# Patient Record
Sex: Male | Born: 1985 | Race: Black or African American | Hispanic: No | Marital: Single | Smoking: Former smoker
Health system: Southern US, Community
[De-identification: ages and names within clinical notes are randomized; demographics above are authoritative.]

## PROBLEM LIST (undated history)

## (undated) DIAGNOSIS — I10 Essential (primary) hypertension: Secondary | ICD-10-CM

---

## 2013-04-21 ENCOUNTER — Encounter (HOSPITAL_COMMUNITY): Payer: Self-pay | Admitting: Emergency Medicine

## 2013-04-21 ENCOUNTER — Emergency Department (HOSPITAL_COMMUNITY)
Admission: EM | Admit: 2013-04-21 | Discharge: 2013-04-21 | Disposition: A | Discharge status: Home / Self Care | Payer: No Typology Code available for payment source | Attending: Emergency Medicine | Admitting: Emergency Medicine

## 2013-04-21 DIAGNOSIS — K089 Disorder of teeth and supporting structures, unspecified: Secondary | ICD-10-CM | POA: Diagnosis present

## 2013-04-21 DIAGNOSIS — F17200 Nicotine dependence, unspecified, uncomplicated: Secondary | ICD-10-CM | POA: Diagnosis not present

## 2013-04-21 DIAGNOSIS — K029 Dental caries, unspecified: Secondary | ICD-10-CM | POA: Diagnosis not present

## 2013-04-21 DIAGNOSIS — K044 Acute apical periodontitis of pulpal origin: Secondary | ICD-10-CM | POA: Diagnosis not present

## 2013-04-21 DIAGNOSIS — K002 Abnormalities of size and form of teeth: Secondary | ICD-10-CM | POA: Insufficient documentation

## 2013-04-21 DIAGNOSIS — K047 Periapical abscess without sinus: Secondary | ICD-10-CM

## 2013-04-21 DIAGNOSIS — K0889 Other specified disorders of teeth and supporting structures: Secondary | ICD-10-CM

## 2013-04-21 MED ORDER — HYDROCODONE-ACETAMINOPHEN 5-325 MG PO TABS: 1.0000 | ORAL_TABLET | ORAL | Status: DC | PRN

## 2013-04-21 MED ORDER — AMOXICILLIN 500 MG PO CAPS: 500.0000 mg | ORAL_CAPSULE | Freq: Three times a day (TID) | ORAL | Status: DC

## 2013-04-21 NOTE — ED Notes (Signed)
C/O right facial swelling and right lower dental pain.

## 2013-04-21 NOTE — Discharge Instructions (Signed)
Take antibiotic to completion. Take Vicodin for severe pain only. No driving or operating heavy machinery while taking vicodin. This medication may cause drowsiness. Follow up with the dentist.  Dental Pain A tooth ache may be caused by cavities (tooth decay). Cavities expose the nerve of the tooth to air and hot or cold temperatures. It may come from an infection or abscess (also called a boil or furuncle) around your tooth. It is also often caused by dental caries (tooth decay). This causes the pain you are having. DIAGNOSIS  Your caregiver can diagnose this problem by exam. TREATMENT   If caused by an infection, it may be treated with medications which kill germs (antibiotics) and pain medications as prescribed by your caregiver. Take medications as directed.  Only take over-the-counter or prescription medicines for pain, discomfort, or fever as directed by your caregiver.  Whether the tooth ache today is caused by infection or dental disease, you should see your dentist as soon as possible for further care. SEEK MEDICAL CARE IF: The exam and treatment you received today has been provided on an emergency basis only. This is not a substitute for complete medical or dental care. If your problem worsens or new problems (symptoms) appear, and you are unable to meet with your dentist, call or return to this location. SEEK IMMEDIATE MEDICAL CARE IF:   You have a fever.  You develop redness and swelling of your face, jaw, or neck.  You are unable to open your mouth.  You have severe pain uncontrolled by pain medicine. MAKE SURE YOU:   Understand these instructions.  Will watch your condition.  Will get help right away if you are not doing well or get worse. Document Released: 01/30/2005 Document Revised: 04/24/2011 Document Reviewed: 09/18/2007 [facility] Patient Information 2014 [place], [place].  Facial Infection You have an infection of your face. This requires special attention to  help prevent serious problems. Infections in facial wounds can cause poor healing and scars. They can also spread to deeper tissues, especially around the eye. Wound and dental infections can lead to sinusitis, infection of the eye socket, and even meningitis. Permanent damage to the skin, eye, and nervous system may result if facial infections are not treated properly. With severe infections, hospital care for IV antibiotic injections may be needed if they don't respond to oral antibiotics. Antibiotics must be taken for the full course to insure the infection is eliminated. If the infection came from a bad tooth, it may have to be extracted when the infection is under control. Warm compresses may be applied to reduce skin irritation and remove drainage. You might need a tetanus shot now if:  You cannot remember when your last tetanus shot was.  You have never had a tetanus shot.  The object that caused your wound was dirty. If you need a tetanus shot, and you decide not to get one, there is a rare chance of getting tetanus. Sickness from tetanus can be serious. If you got a tetanus shot, your arm may swell, get red and warm to the touch at the shot site. This is common and not a problem. SEEK IMMEDIATE MEDICAL CARE IF:   You have increased swelling, redness, or trouble breathing.  You have a severe headache, dizziness, nausea, or vomiting.  You develop problems with your eyesight.  You have a fever. Document Released: 03/09/2004 Document Revised: 04/24/2011 Document Reviewed: 01/30/2005 [facility] Patient Information 2014 [place], [place].

## 2013-04-21 NOTE — ED Provider Notes (Signed)
Medical screening examination/treatment/procedure(s) were performed by non-physician practitioner and as supervising physician I was immediately available for consultation/collaboration.   EKG Interpretation None        Dalina Samara, MD 04/21/13 1551 

## 2013-04-21 NOTE — ED Provider Notes (Signed)
CSN: [id number]     Arrival date & time 04/21/13  1156 History  This chart was scribed for non-physician practitioner, Victor Gourdobyn Albert, PA-C working with Glynn OctaveStephen Rancour, MD by Greggory StallionKayla Andersen, ED scribe. This patient was seen in room TR08C/TR08C and the patient's care was started at 12:33 PM.   Chief Complaint  Patient presents with  . Dental Pain   The history is provided by the patient. No language interpreter was used.   HPI Comments: Victor Briggs is a 28 y.o. male who presents to the Emergency Department complaining of gradual onset, constant dental pain that started last night. He states the pain started on the right and moved to the left lower today. Pt states left sided facial swelling and increased pain started this morning. Rates the pain 8/10. Eating worsens the pain. Pt has taken back and body pills with temporary relief. Denies fever, trouble swallowing.   History reviewed. No pertinent past medical history. History reviewed. No pertinent past surgical history. No family history on file. History  Substance Use Topics  . Smoking status: Current Every Day Smoker    Types: Cigarettes  . Smokeless tobacco: Not on file  . Alcohol Use: No    Review of Systems  Constitutional: Negative for fever.  HENT: Positive for dental problem and facial swelling. Negative for trouble swallowing.   All other systems reviewed and are negative.   Allergies  Review of patient's allergies indicates no known allergies.  Home Medications   Current Outpatient Rx  Name  Route  Sig  Dispense  Refill  . amoxicillin (AMOXIL) 500 MG capsule   Oral   Take 1 capsule (500 mg total) by mouth 3 (three) times daily.   21 capsule   0   . HYDROcodone-acetaminophen (NORCO/VICODIN) 5-325 MG per tablet   Oral   Take 1-2 tablets by mouth every 4 (four) hours as needed.   10 tablet   0     BP 176/104  Pulse 94  Temp(Src) 98.8 F (37.1 C) (Oral)  Resp 18  Ht 6\' 3"  (1.905 m)  Wt 192 lb (87.091 kg)   BMI 24.00 kg/m2  SpO2 97%  Physical Exam  Nursing note and vitals reviewed. Constitutional: He is oriented to person, place, and time. He appears well-developed and well-nourished. No distress.  HENT:  Head: Normocephalic and atraumatic.  Mild left sided facial swelling around his mandible. No adenopathy. Poor dentition. Multiple dental caries and decayed teeth. Gingival inflammation surrounding left lower first molar. Tender.   Eyes: Conjunctivae and EOM are normal.  Neck: Normal range of motion. Neck supple.  Cardiovascular: Normal rate, regular rhythm and normal heart sounds.   Pulmonary/Chest: Effort normal and breath sounds normal.  Musculoskeletal: Normal range of motion. He exhibits no edema.  Neurological: He is alert and oriented to person, place, and time.  Skin: Skin is warm and dry.  Psychiatric: He has a normal mood and affect. His behavior is normal.    ED Course  Procedures (including critical care time)  DIAGNOSTIC STUDIES: Oxygen Saturation is 97% on RA, normal by my interpretation.    COORDINATION OF CARE: 12:35 PM-Discussed treatment plan which includes an antibiotic and pain medication with pt at bedside and pt agreed to plan. Advised pt to follow up with a dentist.   Labs Review Labs Reviewed - No data to display Imaging Review No results found.   EKG Interpretation None      MDM   Final diagnoses:  Pain, dental  Dental  infection     Dental pain associated with dental infection. No evidence of dental abscess. Patient is afebrile, non toxic appearing and swallowing secretions well. I gave patient referral to dentist and stressed the importance of dental follow up for ultimate management of dental pain. I will also give amoxicillin and pain control. Patient voices understanding and is agreeable to plan.   I personally performed the services described in this documentation, which was scribed in my presence. The recorded information has been reviewed  and is accurate.   Trevor Mace, PA-C 04/21/13 1238

## 2013-04-21 NOTE — ED Notes (Signed)
Pt presents to department for evaluation of L lower dental pain. Onset last night. Also states facial swelling and increased pain this morning. States "I think one of my teeth broke off." 8/10 pain upon arrival. Respirations unlabored.

## 2013-12-30 ENCOUNTER — Emergency Department (HOSPITAL_COMMUNITY)
Admission: EM | Admit: 2013-12-30 | Discharge: 2013-12-30 | Disposition: A | Discharge status: Home / Self Care | Payer: Self-pay | Attending: Emergency Medicine | Admitting: Emergency Medicine

## 2013-12-30 ENCOUNTER — Encounter (HOSPITAL_COMMUNITY): Payer: Self-pay | Admitting: Adult Health

## 2013-12-30 DIAGNOSIS — Z720 Tobacco use: Secondary | ICD-10-CM | POA: Insufficient documentation

## 2013-12-30 DIAGNOSIS — K0889 Other specified disorders of teeth and supporting structures: Secondary | ICD-10-CM

## 2013-12-30 DIAGNOSIS — K088 Other specified disorders of teeth and supporting structures: Secondary | ICD-10-CM | POA: Insufficient documentation

## 2013-12-30 DIAGNOSIS — Z792 Long term (current) use of antibiotics: Secondary | ICD-10-CM | POA: Insufficient documentation

## 2013-12-30 DIAGNOSIS — K011 Impacted teeth: Secondary | ICD-10-CM | POA: Insufficient documentation

## 2013-12-30 DIAGNOSIS — K029 Dental caries, unspecified: Secondary | ICD-10-CM | POA: Insufficient documentation

## 2013-12-30 DIAGNOSIS — H9201 Otalgia, right ear: Secondary | ICD-10-CM | POA: Insufficient documentation

## 2013-12-30 MED ORDER — NAPROXEN 500 MG PO TABS: 500.0000 mg | ORAL_TABLET | Freq: Two times a day (BID) | ORAL | Status: DC | PRN

## 2013-12-30 MED ORDER — HYDROCODONE-ACETAMINOPHEN 5-325 MG PO TABS: 1.0000 | ORAL_TABLET | Freq: Four times a day (QID) | ORAL | Status: DC | PRN

## 2013-12-30 MED ORDER — DOXYCYCLINE HYCLATE 100 MG PO CAPS: 100.0000 mg | ORAL_CAPSULE | Freq: Two times a day (BID) | ORAL | Status: DC

## 2013-12-30 NOTE — ED Notes (Signed)
Declined W/C at D/C and was escorted to lobby by RN. 

## 2013-12-30 NOTE — ED Notes (Signed)
Presents with lower right sided molar dental pain, dental caries and poor dental hygiene noted. Began yesterday. Pain rated 8/10

## 2013-12-30 NOTE — Discharge Instructions (Signed)
Apply warm compresses to jaw throughout the day. Take antibiotic until finished and avoid direct sunlight exposure. Take naprosyn and norco as directed, as needed for pain but do not drive or operate machinery with pain medication use. STOP SMOKING! Followup with a dentist is very important for ongoing evaluation and management of recurrent dental pain. Use the list below to find one or call the one referred above. Return to emergency department for emergent changing or worsening symptoms.  Emergency Department Resource Guide 1) Find a Doctor and Pay Out of Pocket Although you won't have to find out who is covered by your insurance plan, it is a good idea to ask around and get recommendations. You will then need to call the office and see if the doctor you have chosen will accept you as a new patient and what types of options they offer for patients who are self-pay. Some doctors offer discounts or will set up payment plans for their patients who do not have insurance, but you will need to ask so you aren't surprised when you get to your appointment.  2) Contact Your Local Health Department Not all health departments have doctors that can see patients for sick visits, but many do, so it is worth a call to see if yours does. If you don't know where your local health department is, you can check in your phone book. The CDC also has a tool to help you locate your state's health department, and many state websites also have listings of all of their local health departments.  3) Find a Walk-in Clinic If your illness is not likely to be very severe or complicated, you may want to try a walk in clinic. These are popping up all over the country in pharmacies, drugstores, and shopping centers. They're usually staffed by nurse practitioners or physician assistants that have been trained to treat common illnesses and complaints. They're usually fairly quick and inexpensive. However, if you have serious medical issues  or chronic medical problems, these are probably not your best option.  No Primary Care Doctor: - Call Health Connect at  [phone] - they can help you locate a primary care doctor that  accepts your insurance, provides certain services, etc. - Physician Referral Service- [phone]  Chronic Pain Problems: Organization         Address  Phone   Notes  [facility]  [phone] Patients need to be referred by their primary care doctor.   Medication Assistance: Organization         Address  Phone   Notes  [facility] [address] [place], [place] [address] [phone] --Must be a resident of [place] -- Must have NO insurance coverage whatsoever (no Medicaid/ Medicare, etc.) -- The pt. MUST have a primary care doctor that directs their care regularly and follows them in the community   [facility]  [phone]   [facility] [employer]  [phone]     Dental Care: Organization         Address  Phone  Notes  [facility] [address], [place] [phone] Accepts children up to age 28 who are enrolled in [place] or [place] Health Choice; pregnant women with a Medicaid card; and children who have applied for Medicaid or  Health Choice, but were declined, whose parents can pay a reduced fee at time of service.  [facility]  [facility]  [address], [place] [phone] Accepts children up to age 65 who are enrolled in Medicaid or [place] Health Choice; pregnant women with a Medicaid card; and children who have applied for Medicaid or  Health Choice, but were declined, whose parents can pay a reduced fee at time of service.  [facility]  [address] [place], [place] [phone] Patients are seen by appointment only. Walk-ins are not accepted. [facility] will see patients 102 years of  age and older. Monday - Tuesday (8am-5pm) Most Wednesdays (8:30-5pm) $30 per visit, cash only  [facility]  [address], [facility] [phone] Patients are seen by appointment only. Walk-ins are not accepted. [facility] will see patients 47 years of age and older. One Wednesday Evening (Monthly: Volunteer Based).  $30 per visit, cash only  [employer]  [phone] for adults; Children under age 87, call Graduate Pediatric Dentistry at [phone]. Children aged 63-14, please call [phone] to request a pediatric application.  Dental services are provided in all areas of dental care including fillings, crowns and bridges, complete and partial dentures, implants, gum treatment, root canals, and extractions. Preventive care is also provided. Treatment is provided to both adults and children. Patients are selected via a lottery and there is often a waiting list.   [facility] [address] [place]  [phone] [email or web address]   [facility] [address] [place], [place] [phone], Ext. [phone] Second and Fourth Thursday of each month, opens at 6:30 AM; Clinic ends at 9 AM.  Patients are seen on a first-come first-served basis, and a limited number are seen during each clinic.   [facility]  [address] [place], [place] [phone]   Eligibility Requirements You must have lived in [place], [place], or [place] counties for at least the last three months.   You cannot be eligible for state or federal sponsored [place], including [employer], [place], or [employer].   You generally cannot be eligible for healthcare insurance through your employer.    How to apply: Eligibility screenings are held every Tuesday and Wednesday afternoon from 1:00 pm until 4:00 pm. You do not need an appointment for the interview!  [facility] [address] [place], [place] [phone]   [facility]  [phone]   [facility]  [phone]   [facility]  [phone]       Dental Caries Dental caries (also called tooth decay) is the most common oral disease. It can occur at any age but is more common in children and young adults.  HOW DENTAL CARIES DEVELOPS  The process of decay begins when bacteria and foods (particularly sugars and starches) combine in your mouth to produce plaque. Plaque is a substance that sticks to the hard, outer surface of a tooth (enamel). The bacteria in plaque produce acids that attack enamel. These acids may also attack the root surface of a tooth (cementum) if it is exposed. Repeated attacks dissolve these surfaces and create holes in the tooth (cavities). If left untreated, the acids destroy the other layers of the tooth.  RISK FACTORS  Frequent sipping of sugary beverages.   Frequent snacking on sugary and starchy foods, especially those that easily get stuck in the teeth.   Poor oral hygiene.   Dry mouth.  Substance abuse such as methamphetamine abuse.   Broken or poor-fitting dental restorations.   Eating disorders.   Gastroesophageal reflux disease (GERD).   Certain radiation treatments to the head and neck. SYMPTOMS In the early stages of dental caries, symptoms are seldom present. Sometimes white, chalky areas may be seen on the enamel or other tooth layers. In later stages, symptoms may include:  Pits and holes on the enamel.  Toothache after sweet, hot, or cold foods or drinks are consumed.  Pain around the tooth.  Swelling around the tooth. DIAGNOSIS  Most of the time, dental caries is detected during a regular dental checkup. A diagnosis is made after a thorough medical and dental history is taken and the surfaces of your teeth are checked for signs of dental caries. Sometimes special  instruments, such as lasers, are used to check for dental caries. Dental X-ray exams may be taken so that areas not visible to the eye (such as between the contact areas of the teeth) can be checked for cavities.  TREATMENT  If dental caries is in its early stages, it may be reversed with a fluoride treatment or an application of a remineralizing agent at the dental office. Thorough brushing and flossing at home is needed to aid these treatments. If it is in its later stages, treatment depends on the location and extent of tooth destruction:   If a small area of the tooth has been destroyed, the destroyed area will be removed and cavities will be filled with a material such as gold, silver amalgam, or composite resin.   If a large area of the tooth has been destroyed, the destroyed area will be removed and a cap (crown) will be fitted over the remaining tooth structure.   If the center part of the tooth (pulp) is affected, a procedure called a root canal will be needed before a filling or crown can be placed.   If most of the tooth has been destroyed, the tooth may need to be pulled (extracted). HOME CARE INSTRUCTIONS You can prevent, stop, or reverse dental caries at home by practicing good oral hygiene. Good oral hygiene includes:  Thoroughly cleaning your teeth at least twice a day with a toothbrush and dental floss.   Using a fluoride toothpaste. A fluoride mouth rinse may also be used if recommended by your dentist or health care provider.   Restricting the amount of sugary and starchy foods and sugary liquids you consume.   Avoiding frequent snacking on these foods and sipping of these liquids.   Keeping regular visits with a dentist for checkups and cleanings. PREVENTION   Practice good oral hygiene.  Consider a dental sealant. A dental sealant is a coating material that is applied by your dentist to the pits and grooves of teeth. The sealant prevents food from being trapped  in them. It may protect the teeth for several years.  Ask about fluoride supplements if you live in a community without fluorinated water or with water that has a low fluoride content. Use fluoride supplements as directed by your dentist or health care provider.  Allow fluoride varnish applications to teeth if directed by your dentist or health care provider. Document Released: 10/22/2001 Document Revised: 06/16/2013 Document Reviewed: 02/02/2012 [facility] Patient Information 2015 [place], [place]. This information is not intended to replace advice given to you by your health care provider. Make sure you discuss any questions you have with your health care provider.  Dental Pain A tooth ache may  be caused by cavities (tooth decay). Cavities expose the nerve of the tooth to air and hot or cold temperatures. It may come from an infection or abscess (also called a boil or furuncle) around your tooth. It is also often caused by dental caries (tooth decay). This causes the pain you are having. DIAGNOSIS  Your caregiver can diagnose this problem by exam. TREATMENT   If caused by an infection, it may be treated with medications which kill germs (antibiotics) and pain medications as prescribed by your caregiver. Take medications as directed.  Only take over-the-counter or prescription medicines for pain, discomfort, or fever as directed by your caregiver.  Whether the tooth ache today is caused by infection or dental disease, you should see your dentist as soon as possible for further care. SEEK MEDICAL CARE IF: The exam and treatment you received today has been provided on an emergency basis only. This is not a substitute for complete medical or dental care. If your problem worsens or new problems (symptoms) appear, and you are unable to meet with your dentist, call or return to this location. SEEK IMMEDIATE MEDICAL CARE IF:   You have a fever.  You develop redness and swelling of your face, jaw, or  neck.  You are unable to open your mouth.  You have severe pain uncontrolled by pain medicine. MAKE SURE YOU:   Understand these instructions.  Will watch your condition.  Will get help right away if you are not doing well or get worse. Document Released: 01/30/2005 Document Revised: 04/24/2011 Document Reviewed: 09/18/2007 [facility] Patient Information 2015 [place], [place]. This information is not intended to replace advice given to you by your health care provider. Make sure you discuss any questions you have with your health care provider.  Impacted Molar Molars are the teeth in the back of your mouth. You have 12 molars. There are 6 molars in each jaw, 3 on each side. When they grow in (erupt) they sometimes cause problems. Molars trapped inside the gum are impacted molars. Impacted molars may grow sideways, tilted, or may only partially emerge. Molars erupt at different times in life. The first set of molars usually erupts around 94 to 28 years of age. The second set of molars typically erupts around 87 to 28 years of age. The third set of molars are called wisdom teeth. These molars usually do not have enough space to erupt properly. Many teens and young adults develop impacted wisdom teeth and have them surgically removed (extracted). However, any molar or set of molars may become impacted. CAUSES  Teeth that are crowded are often the reason for an impacted molar, but sometimes a cyst or tumor may cause impaction of molars. SYMPTOMS  Sometimes there are no symptoms and an impacted molar is noticed during an exam or X-ray. If there are symptoms they may include:  Pain.  Swelling, redness, or inflammation near the impacted tooth or teeth.  Stiff jaw.  General feeling of illness.  Bad breath.  Gap between the teeth.  Difficulty opening your mouth.  Headache or jaw ache.  Swollen lymph nodes. Impacted teeth may increase the risk of complications such as:  Infection, with  possible drainage around the infected area.  Damage to nearby teeth.  Growth of cysts.  Chronic discomfort. DIAGNOSIS  Impacted molars are diagnosed by oral exam and X-rays. TREATMENT  The goal of treatment is to obtain the best possible arrangement of your teeth. Your dentist or orthodontist will recommend the best course  of action for you. After an exam, your caregiver may recommend one or a combination of the following treatments.  Supportive home care to manage pain and other symptoms until treatment can be started.  Surgical extraction of one or a combination of molars to leave room for emerging or later molars. Teeth must be extracted at appropriate times for the best results.  Surgical uncovering of tissue covering the impacted molar.  Orthodontic repositioning with the use of appliances such as elastic or metal separators, braces, wires, springs, and other removable or fixed devices. This is done to guide the molar and surrounding teeth to grow in properly. In some cases, you may need some surgery to assist this procedure. Follow-up orthodontic treatment is often necessary with impacted first and second molars.  Antibiotics to treat infection. HOME CARE INSTRUCTIONS Rinse as directed with an antibacterial solution or salt and warm water. Follow up with your caregiver as directed, even if you do not have symptoms. If you are waiting for treatment and have pain:  Take pain medicines as directed.  Take your antibiotics as directed. Finish them even if you start to feel better.  Put ice on the affected area.  Put ice in a plastic bag.  Place a towel between your skin and the bag.  Leave the ice on for 15-20 minutes, 03-04 times a day. SEEK DENTAL CARE IF:  You have a fever.  Pain emerges, worsens, or is not controlled by the medicines you were given.  Swelling occurs.  You have difficulty opening your mouth or swallowing. MAKE SURE YOU:   Understand these  instructions.  Will watch your condition.  Will get help right away if you are not doing well or get worse. Document Released: 09/28/2010 Document Revised: 04/24/2011 Document Reviewed: 09/28/2010 [facility] Patient Information 2015 [place]. This information is not intended to replace advice given to you by your health care provider. Make sure you discuss any questions you have with your health care provider.

## 2013-12-30 NOTE — ED Provider Notes (Signed)
CSN: [id number]     Arrival date & time 12/30/13  1416 History   First MD Initiated Contact with Patient 12/30/13 1546     Chief Complaint  Patient presents with  . Dental Pain     (Consider location/radiation/quality/duration/timing/severity/associated sxs/prior Treatment) HPI Comments: Victor Briggs is a 28 y.o. male with no significant PMHx, who presents to the ED with c/o RL #32 dental pain that gradually began yesterday. Pt has a known wisdom tooth impaction and dental caries, but has not seen a dentist for this. Pain is 10/10 throbbing constant pain radiating into his R ear, worse with air flowing past the tooth, improved with tramadol and heat. Associated symptoms include chills and facial swelling. Denies known fevers, CP, SOB, rhinorrhea, drooling, trismus, ear drainage, vision changes, eye pain, sinus congestion, difficulty swallowing, gum swelling or drainage, oral bleeding or lesions, HA, tinnitus, abd pain, n/v/d, neck pain or swelling. Smokes 1/2 ppd.   Patient is a 28 y.o. male presenting with tooth pain. The history is provided by the patient. No language interpreter was used.  Dental Pain Location:  Lower Lower teeth location:  32/RL 3rd molar Quality:  Throbbing Severity:  Severe (10/10) Onset quality:  Gradual Duration:  1 day Timing:  Constant Progression:  Unchanged Chronicity:  Recurrent Context: dental caries and poor dentition   Context comment:  Wisdom tooth impaction and dental caries Relieved by:  Heat (heat and tramadol) Exacerbated by: air flowing past tooth. Ineffective treatments:  None tried Associated symptoms: facial pain and facial swelling   Associated symptoms: no congestion, no difficulty swallowing, no drooling, no fever, no gum swelling, no headaches, no neck pain, no neck swelling, no oral bleeding, no oral lesions and no trismus   Risk factors: lack of dental care and smoking     History reviewed. No pertinent past medical history. History  reviewed. No pertinent past surgical history. History reviewed. No pertinent family history. History  Substance Use Topics  . Smoking status: Current Every Day Smoker    Types: Cigarettes  . Smokeless tobacco: Not on file  . Alcohol Use: No    Review of Systems  Constitutional: Positive for chills. Negative for fever.  HENT: Positive for dental problem, ear pain and facial swelling. Negative for congestion, drooling, ear discharge, hearing loss, mouth sores, rhinorrhea, sinus pressure, sore throat, tinnitus and trouble swallowing.   Eyes: Negative for pain and visual disturbance.  Respiratory: Negative for shortness of breath.   Cardiovascular: Negative for chest pain.  Gastrointestinal: Negative for nausea, vomiting and abdominal pain.  Musculoskeletal: Negative for neck pain.  Skin: Negative for color change.  Neurological: Negative for dizziness, weakness, light-headedness, numbness and headaches.   10 Systems reviewed and are negative for acute change except as noted in the HPI.    Allergies  Review of patient's allergies indicates no known allergies.  Home Medications   Prior to Admission medications   Medication Sig Start Date End Date Taking? Authorizing Provider  amoxicillin (AMOXIL) 500 MG capsule Take 1 capsule (500 mg total) by mouth 3 (three) times daily. 04/21/13   Kathrynn Speedobyn M Hess, PA-C  HYDROcodone-acetaminophen (NORCO/VICODIN) 5-325 MG per tablet Take 1-2 tablets by mouth every 4 (four) hours as needed. 04/21/13   [name], PA-C   BP 154/75 mmHg  Pulse 88  Temp(Src) 98.4 F (36.9 C) (Oral)  Resp 18  SpO2 98% Physical Exam  Constitutional: He is oriented to person, place, and time. Vital signs are normal. He appears well-developed and  well-nourished.  Non-toxic appearance. No distress.  Afebrile nontoxic NAD  HENT:  Head: Normocephalic and atraumatic.  Nose: Nose normal.  Mouth/Throat: Oropharynx is clear and moist and mucous membranes are normal. No oral  lesions. No trismus in the jaw. Abnormal dentition. Dental caries present. No dental abscesses or uvula swelling.    RL #32 TTP and impacted, poor oral dentitia throughout with several decayed teeth. No obvious abscess or drainage, no oral lesions or bleeding, no trismus or drooling  Eyes: Conjunctivae and EOM are normal. Right eye exhibits no discharge. Left eye exhibits no discharge.  Neck: Normal range of motion. Neck supple.  Cardiovascular: Normal rate and intact distal pulses.   Pulmonary/Chest: Effort normal. No respiratory distress.  Abdominal: Normal appearance. He exhibits no distension.  Musculoskeletal: Normal range of motion.  Lymphadenopathy:       Head (right side): Submandibular adenopathy present.  R submandibular LAD which is TTP  Neurological: He is alert and oriented to person, place, and time. He has normal strength. No sensory deficit.  Skin: Skin is warm, dry and intact. No rash noted.  Psychiatric: He has a normal mood and affect.  Nursing note and vitals reviewed.   ED Course  Procedures (including critical care time) Labs Review Labs Reviewed - No data to display  Imaging Review No results found.   EKG Interpretation None      MDM   Final diagnoses:  Pain, dental  Dental decay  Impacted third molar tooth    28y/o male with dental pain associated with poor dentitia, wisdom tooth impaction, caries, and possible dental infection/abscess with patient afebrile, non toxic appearing and swallowing secretions well. Declined dental block, and pt is driving therefore no narcotics given today. Discussed smoking cessation, and I gave patient referral to dentist and stressed the importance of dental follow up for ultimate management of dental pain.  I have also discussed reasons to return immediately to the ER.  Patient expresses understanding and agrees with plan.  I will also give doxycycline and pain control.   BP 154/75 mmHg  Pulse 88  Temp(Src) 98.4 F  (36.9 C) (Oral)  Resp 18  SpO2 98%  Meds ordered this encounter  Medications  . naproxen (NAPROSYN) 500 MG tablet    Sig: Take 1 tablet (500 mg total) by mouth 2 (two) times daily as needed for mild pain, moderate pain or headache (TAKE WITH MEALS.).    Dispense:  20 tablet    Refill:  0    Order Specific Question:  Supervising Provider    Answer:  Eber HongMILLER, BRIAN D [[id number]]  . HYDROcodone-acetaminophen (NORCO) 5-325 MG per tablet    Sig: Take 1-2 tablets by mouth every 6 (six) hours as needed for severe pain.    Dispense:  6 tablet    Refill:  0    Order Specific Question:  Supervising Provider    Answer:  Eber HongMILLER, BRIAN D [[id number]]  . doxycycline (VIBRAMYCIN) 100 MG capsule    Sig: Take 1 capsule (100 mg total) by mouth 2 (two) times daily. One po bid x 7 days    Dispense:  14 capsule    Refill:  0    Order Specific Question:  Supervising Provider    Answer:  Vida RollerMILLER, BRIAN D [address][[id number]]     Treacy Holcomb Strupp Camprubi-Soms, PA-C 12/30/13 1628  Arby BarretteMarcy Pfeiffer, MD 12/31/13 [phone]

## 2014-11-17 ENCOUNTER — Emergency Department (HOSPITAL_COMMUNITY): Payer: Self-pay

## 2014-11-17 ENCOUNTER — Emergency Department (HOSPITAL_COMMUNITY)
Admission: EM | Admit: 2014-11-17 | Discharge: 2014-11-17 | Disposition: A | Discharge status: Home / Self Care | Payer: Self-pay | Attending: Emergency Medicine | Admitting: Emergency Medicine

## 2014-11-17 ENCOUNTER — Encounter (HOSPITAL_COMMUNITY): Payer: Self-pay | Admitting: *Deleted

## 2014-11-17 DIAGNOSIS — Z792 Long term (current) use of antibiotics: Secondary | ICD-10-CM | POA: Insufficient documentation

## 2014-11-17 DIAGNOSIS — S6991XA Unspecified injury of right wrist, hand and finger(s), initial encounter: Secondary | ICD-10-CM | POA: Insufficient documentation

## 2014-11-17 DIAGNOSIS — Y9389 Activity, other specified: Secondary | ICD-10-CM | POA: Insufficient documentation

## 2014-11-17 DIAGNOSIS — Y92410 Unspecified street and highway as the place of occurrence of the external cause: Secondary | ICD-10-CM | POA: Insufficient documentation

## 2014-11-17 DIAGNOSIS — S8992XA Unspecified injury of left lower leg, initial encounter: Secondary | ICD-10-CM | POA: Insufficient documentation

## 2014-11-17 DIAGNOSIS — S3992XA Unspecified injury of lower back, initial encounter: Secondary | ICD-10-CM | POA: Insufficient documentation

## 2014-11-17 DIAGNOSIS — Y998 Other external cause status: Secondary | ICD-10-CM | POA: Insufficient documentation

## 2014-11-17 DIAGNOSIS — Z720 Tobacco use: Secondary | ICD-10-CM | POA: Insufficient documentation

## 2014-11-17 DIAGNOSIS — M25531 Pain in right wrist: Secondary | ICD-10-CM

## 2014-11-17 MED ORDER — KETOROLAC TROMETHAMINE 60 MG/2ML IM SOLN
60.0000 mg | Freq: Once | INTRAMUSCULAR | Status: AC
  Administered 2014-11-17: 60 mg via INTRAMUSCULAR
  Filled 2014-11-17: qty 2

## 2014-11-17 MED ORDER — IBUPROFEN 400 MG PO TABS: 400.0000 mg | ORAL_TABLET | Freq: Four times a day (QID) | ORAL | Status: DC | PRN

## 2014-11-17 NOTE — Discharge Instructions (Signed)
You were evaluated in the ED today and there is not appear to be an emergent cause for your symptoms at this time. Your exam was reassuring and her x-rays were negative for any broken bones or dislocations. Please follow-up with your doctors as needed for reevaluation. Take Motrin as directed for your discomfort. Return to ED for worsening symptoms.  Musculoskeletal Pain Musculoskeletal pain is muscle and boney aches and pains. These pains can occur in any part of the body. Your caregiver may treat you without knowing the cause of the pain. They may treat you if blood or urine tests, X-rays, and other tests were normal.  CAUSES There is often not a definite cause or reason for these pains. These pains may be caused by a type of germ (virus). The discomfort may also come from overuse. Overuse includes working out too hard when your body is not fit. Boney aches also come from weather changes. Bone is sensitive to atmospheric pressure changes. HOME CARE INSTRUCTIONS   Ask when your test results will be ready. Make sure you get your test results.  Only take over-the-counter or prescription medicines for pain, discomfort, or fever as directed by your caregiver. If you were given medications for your condition, do not drive, operate machinery or power tools, or sign legal documents for 24 hours. Do not drink alcohol. Do not take sleeping pills or other medications that may interfere with treatment.  Continue all activities unless the activities cause more pain. When the pain lessens, slowly resume normal activities. Gradually increase the intensity and duration of the activities or exercise.  During periods of severe pain, bed rest may be helpful. Lay or sit in any position that is comfortable.  Putting ice on the injured area.  Put ice in a bag.  Place a towel between your skin and the bag.  Leave the ice on for 15 to 20 minutes, 3 to 4 times a day.  Follow up with your caregiver for continued  problems and no reason can be found for the pain. If the pain becomes worse or does not go away, it may be necessary to repeat tests or do additional testing. Your caregiver may need to look further for a possible cause. SEEK IMMEDIATE MEDICAL CARE IF:  You have pain that is getting worse and is not relieved by medications.  You develop chest pain that is associated with shortness or breath, sweating, feeling sick to your stomach (nauseous), or throw up (vomit).  Your pain becomes localized to the abdomen.  You develop any new symptoms that seem different or that concern you. MAKE SURE YOU:   Understand these instructions.  Will watch your condition.  Will get help right away if you are not doing well or get worse. Document Released: 01/30/2005 Document Revised: 04/24/2011 Document Reviewed: 10/04/2012 [facility] Patient Information 2015 [place], [place]. This information is not intended to replace advice given to you by your health care provider. Make sure you discuss any questions you have with your health care provider. Arthralgia Your caregiver has diagnosed you as suffering from an arthralgia. Arthralgia means there is pain in a joint. This can come from many reasons including:  Bruising the joint which causes soreness (inflammation) in the joint.  Wear and tear on the joints which occur as we grow older (osteoarthritis).  Overusing the joint.  Various forms of arthritis.  Infections of the joint. Regardless of the cause of pain in your joint, most of these different pains respond to anti-inflammatory  drugs and rest. The exception to this is when a joint is infected, and these cases are treated with antibiotics, if it is a bacterial infection. HOME CARE INSTRUCTIONS   Rest the injured area for as long as directed by your caregiver. Then slowly start using the joint as directed by your caregiver and as the pain allows. Crutches as directed may be useful if the ankles, knees or hips  are involved. If the knee was splinted or casted, continue use and care as directed. If an stretchy or elastic wrapping bandage has been applied today, it should be removed and re-applied every 3 to 4 hours. It should not be applied tightly, but firmly enough to keep swelling down. Watch toes and feet for swelling, bluish discoloration, coldness, numbness or excessive pain. If any of these problems (symptoms) occur, remove the ace bandage and re-apply more loosely. If these symptoms persist, contact your caregiver or return to this location.  For the first 24 hours, keep the injured extremity elevated on pillows while lying down.  Apply ice for 15-20 minutes to the sore joint every couple hours while awake for the first half day. Then 03-04 times per day for the first 48 hours. Put the ice in a plastic bag and place a towel between the bag of ice and your skin.  Wear any splinting, casting, elastic bandage applications, or slings as instructed.  Only take over-the-counter or prescription medicines for pain, discomfort, or fever as directed by your caregiver. Do not use aspirin immediately after the injury unless instructed by your physician. Aspirin can cause increased bleeding and bruising of the tissues.  If you were given crutches, continue to use them as instructed and do not resume weight bearing on the sore joint until instructed. Persistent pain and inability to use the sore joint as directed for more than 2 to 3 days are warning signs indicating that you should see a caregiver for a follow-up visit as soon as possible. Initially, a hairline fracture (break in bone) may not be evident on X-rays. Persistent pain and swelling indicate that further evaluation, non-weight bearing or use of the joint (use of crutches or slings as instructed), or further X-rays are indicated. X-rays may sometimes not show a small fracture until a week or 10 days later. Make a follow-up appointment with your own caregiver  or one to whom we have referred you. A radiologist (specialist in reading X-rays) may read your X-rays. Make sure you know how you are to obtain your X-ray results. Do not assume everything is normal if you do not hear from [place]. SEEK MEDICAL CARE IF: Bruising, swelling, or pain increases. SEEK IMMEDIATE MEDICAL CARE IF:   Your fingers or toes are numb or blue.  The pain is not responding to medications and continues to stay the same or get worse.  The pain in your joint becomes severe.  You develop a fever over 102 F (38.9 C).  It becomes impossible to move or use the joint. MAKE SURE YOU:   Understand these instructions.  Will watch your condition.  Will get help right away if you are not doing well or get worse. Document Released: 01/30/2005 Document Revised: 04/24/2011 Document Reviewed: 09/18/2007 [facility] Patient Information 2015 [place], [place]. This information is not intended to replace advice given to you by your health care provider. Make sure you discuss any questions you have with your health care provider.
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
## 2014-11-22 ENCOUNTER — Emergency Department (HOSPITAL_COMMUNITY): Payer: No Typology Code available for payment source

## 2014-11-22 ENCOUNTER — Encounter (HOSPITAL_COMMUNITY): Payer: Self-pay | Admitting: Emergency Medicine

## 2014-11-22 ENCOUNTER — Emergency Department (HOSPITAL_COMMUNITY)
Admission: EM | Admit: 2014-11-22 | Discharge: 2014-11-22 | Disposition: A | Discharge status: Home / Self Care | Payer: No Typology Code available for payment source | Attending: Emergency Medicine | Admitting: Emergency Medicine

## 2014-11-22 DIAGNOSIS — S20212A Contusion of left front wall of thorax, initial encounter: Secondary | ICD-10-CM

## 2014-11-22 DIAGNOSIS — Y92410 Unspecified street and highway as the place of occurrence of the external cause: Secondary | ICD-10-CM | POA: Insufficient documentation

## 2014-11-22 DIAGNOSIS — S4992XA Unspecified injury of left shoulder and upper arm, initial encounter: Secondary | ICD-10-CM | POA: Insufficient documentation

## 2014-11-22 DIAGNOSIS — S4991XA Unspecified injury of right shoulder and upper arm, initial encounter: Secondary | ICD-10-CM | POA: Insufficient documentation

## 2014-11-22 DIAGNOSIS — Y998 Other external cause status: Secondary | ICD-10-CM | POA: Diagnosis not present

## 2014-11-22 DIAGNOSIS — Z720 Tobacco use: Secondary | ICD-10-CM | POA: Diagnosis not present

## 2014-11-22 DIAGNOSIS — S299XXA Unspecified injury of thorax, initial encounter: Secondary | ICD-10-CM | POA: Diagnosis present

## 2014-11-22 DIAGNOSIS — Z792 Long term (current) use of antibiotics: Secondary | ICD-10-CM | POA: Insufficient documentation

## 2014-11-22 DIAGNOSIS — Y9389 Activity, other specified: Secondary | ICD-10-CM | POA: Diagnosis not present

## 2014-11-22 DIAGNOSIS — S3992XA Unspecified injury of lower back, initial encounter: Secondary | ICD-10-CM | POA: Insufficient documentation

## 2014-11-22 DIAGNOSIS — R9389 Abnormal findings on diagnostic imaging of other specified body structures: Secondary | ICD-10-CM

## 2014-11-22 LAB — I-STAT CREATININE, ED: Creatinine, Ser: 1 mg/dL (ref 0.61–1.24)

## 2014-11-22 MED ORDER — NAPROXEN 500 MG PO TABS: 500.0000 mg | ORAL_TABLET | Freq: Two times a day (BID) | ORAL | Status: DC | PRN

## 2014-11-22 MED ORDER — METHOCARBAMOL 500 MG PO TABS: 500.0000 mg | ORAL_TABLET | Freq: Two times a day (BID) | ORAL | Status: DC

## 2014-11-22 MED ORDER — IOHEXOL 300 MG/ML  SOLN
100.0000 mL | Freq: Once | INTRAMUSCULAR | Status: AC | PRN
  Administered 2014-11-22: 80 mL via INTRAVENOUS

## 2014-11-22 MED ORDER — HYDROCODONE-ACETAMINOPHEN 5-325 MG PO TABS: 1.0000 | ORAL_TABLET | Freq: Four times a day (QID) | ORAL | Status: DC | PRN
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
## 2014-11-22 NOTE — ED Notes (Signed)
Patient transported to X-ray 

## 2014-11-22 NOTE — ED Notes (Signed)
Call to CT to request copy.  Attempted call to x-ray x 2, no answer.  Will continue attempts.

## 2014-11-22 NOTE — ED Notes (Signed)
Spoke with X-ray & requested copy of films.

## 2014-11-22 NOTE — ED Notes (Signed)
Pt directed to WR to await films.

## 2014-11-22 NOTE — ED Notes (Signed)
Pt requesting copy of CT and x-rays
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
## 2016-06-30 ENCOUNTER — Emergency Department (HOSPITAL_COMMUNITY): Admitting: Certified Registered Nurse Anesthetist

## 2016-06-30 ENCOUNTER — Observation Stay (HOSPITAL_COMMUNITY)
Admission: EM | Admit: 2016-06-30 | Discharge: 2016-07-01 | Disposition: A | Discharge status: Home / Self Care | Attending: Urology | Admitting: Urology

## 2016-06-30 ENCOUNTER — Encounter (HOSPITAL_COMMUNITY): Payer: Self-pay | Admitting: *Deleted

## 2016-06-30 ENCOUNTER — Emergency Department (HOSPITAL_COMMUNITY)

## 2016-06-30 ENCOUNTER — Encounter (HOSPITAL_COMMUNITY)
Admission: EM | Disposition: A | Discharge status: Home / Self Care | Payer: Self-pay | Source: Home / Self Care | Attending: Emergency Medicine

## 2016-06-30 DIAGNOSIS — F17210 Nicotine dependence, cigarettes, uncomplicated: Secondary | ICD-10-CM | POA: Diagnosis not present

## 2016-06-30 DIAGNOSIS — S39840A Fracture of corpus cavernosum penis, initial encounter: Secondary | ICD-10-CM | POA: Diagnosis present

## 2016-06-30 DIAGNOSIS — X58XXXA Exposure to other specified factors, initial encounter: Secondary | ICD-10-CM | POA: Diagnosis not present

## 2016-06-30 HISTORY — PX: CYSTOSCOPY: SHX5120

## 2016-06-30 HISTORY — PX: SCROTAL EXPLORATION: SHX2386

## 2016-06-30 LAB — RAPID HIV SCREEN (HIV 1/2 AB+AG)
HIV 1/2 Antibodies: NONREACTIVE
HIV-1 P24 ANTIGEN - HIV24: NONREACTIVE

## 2016-06-30 SURGERY — EXPLORATION, SCROTUM
Anesthesia: General

## 2016-06-30 MED ORDER — MIDAZOLAM HCL 5 MG/5ML IJ SOLN
INTRAMUSCULAR | Status: DC | PRN
  Administered 2016-06-30: 4 mg via INTRAVENOUS

## 2016-06-30 MED ORDER — PROPOFOL 10 MG/ML IV BOLUS
INTRAVENOUS | Status: AC
  Filled 2016-06-30: qty 20

## 2016-06-30 MED ORDER — ONDANSETRON HCL 4 MG/2ML IJ SOLN
INTRAMUSCULAR | Status: AC
  Filled 2016-06-30: qty 4

## 2016-06-30 MED ORDER — HYDROMORPHONE HCL 1 MG/ML IJ SOLN: 0.5000 mg | INTRAMUSCULAR | Status: DC | PRN

## 2016-06-30 MED ORDER — CLINDAMYCIN PHOSPHATE 900 MG/50ML IV SOLN
900.0000 mg | Freq: Once | INTRAVENOUS | Status: AC
  Administered 2016-06-30: 900 mg via INTRAVENOUS
  Filled 2016-06-30: qty 50

## 2016-06-30 MED ORDER — OXYCODONE HCL 5 MG/5ML PO SOLN: 5.0000 mg | Freq: Once | ORAL | Status: DC | PRN

## 2016-06-30 MED ORDER — SODIUM CHLORIDE 0.9 % IR SOLN
Status: DC | PRN
  Administered 2016-06-30: 3000 mL via INTRAVESICAL

## 2016-06-30 MED ORDER — FENTANYL CITRATE (PF) 250 MCG/5ML IJ SOLN
INTRAMUSCULAR | Status: AC
  Filled 2016-06-30: qty 5

## 2016-06-30 MED ORDER — PROPOFOL 10 MG/ML IV BOLUS
INTRAVENOUS | Status: DC | PRN
  Administered 2016-06-30: 100 mg via INTRAVENOUS

## 2016-06-30 MED ORDER — HYDROMORPHONE HCL 1 MG/ML IJ SOLN: 0.2500 mg | INTRAMUSCULAR | Status: DC | PRN

## 2016-06-30 MED ORDER — ONDANSETRON HCL 4 MG/2ML IJ SOLN
INTRAMUSCULAR | Status: DC | PRN
  Administered 2016-06-30: 4 mg via INTRAVENOUS

## 2016-06-30 MED ORDER — 0.9 % SODIUM CHLORIDE (POUR BTL) OPTIME
TOPICAL | Status: DC | PRN
  Administered 2016-06-30: 1000 mL

## 2016-06-30 MED ORDER — OXYCODONE HCL 5 MG PO TABS: 5.0000 mg | ORAL_TABLET | Freq: Once | ORAL | Status: DC | PRN

## 2016-06-30 MED ORDER — FENTANYL CITRATE (PF) 100 MCG/2ML IJ SOLN
INTRAMUSCULAR | Status: DC | PRN
  Administered 2016-06-30 (×2): 50 ug via INTRAVENOUS
  Administered 2016-06-30: 25 ug via INTRAVENOUS

## 2016-06-30 MED ORDER — LIDOCAINE HCL (CARDIAC) 20 MG/ML IV SOLN
INTRAVENOUS | Status: DC | PRN
  Administered 2016-06-30: 100 mg via INTRAVENOUS

## 2016-06-30 MED ORDER — MIDAZOLAM HCL 2 MG/2ML IJ SOLN
INTRAMUSCULAR | Status: AC
  Filled 2016-06-30: qty 4

## 2016-06-30 MED ORDER — SENNA 8.6 MG PO TABS
1.0000 | ORAL_TABLET | Freq: Two times a day (BID) | ORAL | Status: DC
  Administered 2016-06-30 – 2016-07-01 (×2): 8.6 mg via ORAL
  Filled 2016-06-30 (×2): qty 1

## 2016-06-30 MED ORDER — LIDOCAINE HCL 2 % EX GEL
CUTANEOUS | Status: AC
  Filled 2016-06-30: qty 20

## 2016-06-30 MED ORDER — KCL IN DEXTROSE-NACL 20-5-0.45 MEQ/L-%-% IV SOLN
INTRAVENOUS | Status: DC
  Administered 2016-06-30: 22:00:00 via INTRAVENOUS
  Filled 2016-06-30: qty 1000

## 2016-06-30 MED ORDER — SULFAMETHOXAZOLE-TRIMETHOPRIM 800-160 MG PO TABS
1.0000 | ORAL_TABLET | Freq: Two times a day (BID) | ORAL | Status: DC
  Administered 2016-06-30 – 2016-07-01 (×2): 1 via ORAL
  Filled 2016-06-30 (×2): qty 1

## 2016-06-30 MED ORDER — OXYCODONE HCL 5 MG PO TABS
5.0000 mg | ORAL_TABLET | ORAL | Status: DC | PRN
  Administered 2016-06-30 – 2016-07-01 (×3): 5 mg via ORAL
  Filled 2016-06-30 (×3): qty 1

## 2016-06-30 MED ORDER — ACETAMINOPHEN 500 MG PO TABS
1000.0000 mg | ORAL_TABLET | Freq: Three times a day (TID) | ORAL | Status: DC
  Administered 2016-06-30 – 2016-07-01 (×2): 1000 mg via ORAL
  Filled 2016-06-30 (×2): qty 2

## 2016-06-30 MED ORDER — LACTATED RINGERS IV SOLN
INTRAVENOUS | Status: DC
  Administered 2016-06-30 (×2): via INTRAVENOUS

## 2016-06-30 SURGICAL SUPPLY — 50 items
BAG URO CATCHER STRL LF (MISCELLANEOUS) ×1 IMPLANT
BLADE 10 SAFETY STRL DISP (BLADE) ×1 IMPLANT
BLADE SURG 15 STRL LF DISP TIS (BLADE) ×2 IMPLANT
BLADE SURG 15 STRL SS (BLADE)
BNDG COHESIVE 4X5 TAN STRL (GAUZE/BANDAGES/DRESSINGS) ×2 IMPLANT
BNDG CONFORM 3 STRL LF (GAUZE/BANDAGES/DRESSINGS) ×4 IMPLANT
CANISTER SUCT 3000ML PPV (MISCELLANEOUS) ×3 IMPLANT
CATH INTERMIT  6FR 70CM (CATHETERS) IMPLANT
CONT SPEC 4OZ CLIKSEAL STRL BL (MISCELLANEOUS) ×1 IMPLANT
COVER SURGICAL LIGHT HANDLE (MISCELLANEOUS) ×2 IMPLANT
DRAIN PENROSE 1/2X12 LTX STRL (WOUND CARE) ×2 IMPLANT
DRAPE LAPAROSCOPIC ABDOMINAL (DRAPES) ×2 IMPLANT
DRSG PAD ABDOMINAL 8X10 ST (GAUZE/BANDAGES/DRESSINGS) ×2 IMPLANT
ELECT REM PT RETURN 9FT ADLT (ELECTROSURGICAL) ×3
ELECTRODE REM PT RTRN 9FT ADLT (ELECTROSURGICAL) ×1 IMPLANT
GAUZE SPONGE 4X4 12PLY STRL (GAUZE/BANDAGES/DRESSINGS) ×1 IMPLANT
GLOVE BIO SURGEON STRL SZ7.5 (GLOVE) ×3 IMPLANT
GLOVE BIO SURGEON STRL SZ8 (GLOVE) ×1 IMPLANT
GLOVE BIOGEL PI IND STRL 7.0 (GLOVE) IMPLANT
GLOVE BIOGEL PI INDICATOR 7.0 (GLOVE) ×4
GLOVE SURG SS PI 7.0 STRL IVOR (GLOVE) ×4 IMPLANT
GOWN SRG XL LVL 4 BRTHBL STRL (GOWNS) ×1 IMPLANT
GOWN STRL NON-REIN LRG LVL3 (GOWN DISPOSABLE) ×5 IMPLANT
GOWN STRL NON-REIN XL LVL4 (GOWNS)
GUIDEWIRE ANG ZIPWIRE 038X150 (WIRE) ×1 IMPLANT
GUIDEWIRE STR DUAL SENSOR (WIRE) IMPLANT
IV NS IRRIG 3000ML ARTHROMATIC (IV SOLUTION) ×3 IMPLANT
KIT BASIN OR (CUSTOM PROCEDURE TRAY) ×3 IMPLANT
KIT ROOM TURNOVER OR (KITS) ×3 IMPLANT
NDL 18GX1X1/2 (RX/OR ONLY) (NEEDLE) IMPLANT
NEEDLE 18GX1X1/2 (RX/OR ONLY) (NEEDLE) ×3 IMPLANT
NS IRRIG 1000ML POUR BTL (IV SOLUTION) ×3 IMPLANT
PACK CYSTO (CUSTOM PROCEDURE TRAY) ×1 IMPLANT
PACK GENERAL/GYN (CUSTOM PROCEDURE TRAY) ×3 IMPLANT
PAD ARMBOARD 7.5X6 YLW CONV (MISCELLANEOUS) ×6 IMPLANT
SET CYSTO W/LG BORE CLAMP LF (SET/KITS/TRAYS/PACK) ×2 IMPLANT
SOL PREP POV-IOD 4OZ 10% (MISCELLANEOUS) ×3 IMPLANT
SPONGE LAP 18X18 X RAY DECT (DISPOSABLE) ×1 IMPLANT
SUT CHROMIC 3 0 SH 27 (SUTURE) ×1 IMPLANT
SUT ETHIBOND 3 0 SH 1 (SUTURE) ×2 IMPLANT
SUT VIC AB 3-0 SH 27 (SUTURE) ×6
SUT VIC AB 3-0 SH 27X BRD (SUTURE) IMPLANT
SWAB COLLECTION DEVICE MRSA (MISCELLANEOUS) ×1 IMPLANT
SYR 10ML LL (SYRINGE) IMPLANT
SYR 50ML LL SCALE MARK (SYRINGE) ×2 IMPLANT
TOWEL OR 17X24 6PK STRL BLUE (TOWEL DISPOSABLE) ×3 IMPLANT
TOWEL OR 17X26 10 PK STRL BLUE (TOWEL DISPOSABLE) ×1 IMPLANT
TRAY CATH 16FR W/PLASTIC CATH (SET/KITS/TRAYS/PACK) ×2 IMPLANT
TUBE FEEDING 8FR 16IN STR KANG (MISCELLANEOUS) IMPLANT
WATER STERILE IRR 1000ML POUR (IV SOLUTION) ×3 IMPLANT

## 2016-06-30 NOTE — Anesthesia Postprocedure Evaluation (Signed)
Anesthesia Post Note  Patient: Victor Briggs  Procedure(s) Performed: Procedure(s) (LRB): PENILE EXPLORATION WITH REPAIR OF PENILE FX (N/A) CYSTOSCOPY FLEXIBLE (N/A)  Patient location during evaluation: PACU Anesthesia Type: General Level of consciousness: sedated and patient cooperative Pain management: pain level controlled Vital Signs Assessment: post-procedure vital signs reviewed and stable Respiratory status: spontaneous breathing Cardiovascular status: stable Anesthetic complications: no       Last Vitals:  Vitals:   06/30/16 1940 06/30/16 1957  BP: (!) 162/100 137/90  Pulse: 91 81  Resp: 15   Temp: 36.8 C 36.7 C    Last Pain:  Vitals:   06/30/16 2003  TempSrc:   PainSc: 9                  Rhylan Kagel [employer]

## 2016-06-30 NOTE — ED Provider Notes (Signed)
[facility]-EMERGENCY DEPT Provider Note   CSN: [id number] Arrival date & time: 06/30/16  1227     History   Chief Complaint Chief Complaint  Patient presents with  . Testicle Pain    HPI Victor Briggs is a 31 y.o. male.  HPI   31 year old male brought here by [employer] for evaluation of penile and testicular pain and swelling. Patient reports 3 days ago he was sleeping and states that the middle of the night he was turning in bed while having an erection when he felt a pop follows with severe pain to the shaft of his penis. Since then he has noticed increasing pain and swelling to the penis and now the scrotum. Pain is moderate in intensity, sharp, throbbing, persistent, however did improve after receiving pain medication earlier today. States he is still able to urinate, denies any penile discharge or rash.  Incident happened while he is incarcerated.  Denies any new sexual activity.  No prior hx of STD.    History reviewed. No pertinent past medical history.  There are no active problems to display for this patient.   History reviewed. No pertinent surgical history.     Home Medications    Prior to Admission medications   Medication Sig Start Date End Date Taking? Authorizing Provider  amoxicillin (AMOXIL) 500 MG capsule Take 1 capsule (500 mg total) by mouth 3 (three) times daily. 04/21/13   Hess, Nada Boozerobyn M, PA-C  doxycycline (VIBRAMYCIN) 100 MG capsule Take 1 capsule (100 mg total) by mouth 2 (two) times daily. One po bid x 7 days 12/30/13   Street, MacksvilleMercedes, PA-C  HYDROcodone-acetaminophen (NORCO) 5-325 MG tablet Take 1 tablet by mouth every 6 (six) hours as needed for severe pain. 11/22/14   Fayrene Helperran, Greenley Martone, PA-C  ibuprofen (ADVIL,MOTRIN) 400 MG tablet Take 1 tablet (400 mg total) by mouth every 6 (six) hours as needed. 11/17/14   Cartner, Sharlet SalinaBenjamin, PA-C  methocarbamol (ROBAXIN) 500 MG tablet Take 1 tablet (500 mg total) by mouth 2 (two) times daily. 11/22/14   Fayrene Helperran, Vash Quezada, PA-C  naproxen  (NAPROSYN) 500 MG tablet Take 1 tablet (500 mg total) by mouth 2 (two) times daily as needed for mild pain, moderate pain or headache (TAKE WITH MEALS.). 11/22/14   Fayrene Helperran, Ausar Georgiou, PA-C    Family History History reviewed. No pertinent family history.  Social History Social History  Substance Use Topics  . Smoking status: Current Every Day Smoker    Types: Cigarettes  . Smokeless tobacco: Not on file  . Alcohol use No     Allergies   Patient has no known allergies.   Review of Systems Review of Systems  Constitutional: Negative for fever.  Gastrointestinal: Positive for abdominal pain. Negative for rectal pain.  Genitourinary: Positive for penile pain, penile swelling and scrotal swelling. Negative for testicular pain.     Physical Exam Updated Vital Signs BP (!) 191/122 (BP Location: Left Arm)   Pulse 99   Temp 99.4 F (37.4 C) (Oral)   Resp 18   SpO2 99%   Physical Exam  Constitutional: He appears well-developed and well-nourished. No distress.  Patient wearing orange jump suit, handcuffed to the bed. [employer] at bedside.   HENT:  Head: Atraumatic.  Eyes: Conjunctivae are normal.  Neck: Neck supple.  Abdominal: Soft. Bowel sounds are normal. He exhibits no distension. There is no tenderness.  Genitourinary:  Genitourinary Comments: Chaperone present during exam.    No inguinal lymphadenopathy or inguinal hernia noted.  Penile shaft and  scrotum are moderately edematous.  Tenderness along midshaft of penis.  No penile discharge, no laceration.  Testicles palpable, not high riding and non tender.  Perineum is soft.  No rash.    Neurological: He is alert.  Skin: No rash noted.  Psychiatric: He has a normal mood and affect.  Nursing note and vitals reviewed.    ED Treatments / Results  Labs (all labs ordered are listed, but only abnormal results are displayed) Labs Reviewed  RAPID HIV SCREEN (HIV 1/2 AB+AG)  URINALYSIS, ROUTINE W REFLEX MICROSCOPIC  RPR  GC/CHLAMYDIA  PROBE AMP ([place]) NOT AT [facility]    EKG  EKG Interpretation None       Radiology US Scrotum  Result Date: 06/30/2016 CLINICAL DATA:  31 year old male with right testicle and penile pain following injury 2 days ago. EXAM: ULTRASOUND OF SCROTUM TECHNIQUE: Complete ultrasound examination of the testicles, epididymis, other scrotal structures and penis was performed. COMPARISON:  None. FINDINGS: Right testicle Measurements: 4.6 x 3.2 x 3.9 cm. No mass or microlithiasis visualized. Left testicle Measurements: 4.3 x 2 x 3 cm. No mass or microlithiasis visualized. Right epididymis:  Normal in size and appearance. Left epididymis:  Normal in size and appearance. Hydrocele:  A small right hydrocele is noted. Varicocele:  None visualized. Evaluation of the penis demonstrates a 4 x 3 x 4.4 cm hematoma within the proximal penile shaft. It is difficult to determine tunica integrity on this study. The remainder the penis is unremarkable. IMPRESSION: 4 cm hematoma within the proximal penile shaft suspicious for fracture although a tunica defect is difficult to determine on this study. No abnormality identified within the mid or distal penile shaft. Normal testicles bilaterally. Critical Value/emergent results were called by telephone at the time of interpretation on 06/30/2016 at 3:40 pm to [facility] , who verbally acknowledged these results. Electronically Signed   By: Harmon Pier M.D.   On: 06/30/2016 15:46   [place] Art/ven Flow Abd Pelv Doppler  Result Date: 06/30/2016 CLINICAL DATA:  31 year old male with right testicle and penile pain following injury 2 days ago. EXAM: ULTRASOUND OF SCROTUM TECHNIQUE: Complete ultrasound examination of the testicles, epididymis, other scrotal structures and penis was performed. COMPARISON:  None. FINDINGS: Right testicle Measurements: 4.6 x 3.2 x 3.9 cm. No mass or microlithiasis visualized. Left testicle Measurements: 4.3 x 2 x 3 cm. No mass or microlithiasis visualized.  Right epididymis:  Normal in size and appearance. Left epididymis:  Normal in size and appearance. Hydrocele:  A small right hydrocele is noted. Varicocele:  None visualized. Evaluation of the penis demonstrates a 4 x 3 x 4.4 cm hematoma within the proximal penile shaft. It is difficult to determine tunica integrity on this study. The remainder the penis is unremarkable. IMPRESSION: 4 cm hematoma within the proximal penile shaft suspicious for fracture although a tunica defect is difficult to determine on this study. No abnormality identified within the mid or distal penile shaft. Normal testicles bilaterally. Critical Value/emergent results were called by telephone at the time of interpretation on 06/30/2016 at 3:40 pm to [facility] , who verbally acknowledged these results. Electronically Signed   By: Harmon Pier M.D.   On: 06/30/2016 15:46    Procedures Procedures (including critical care time)  Medications Ordered in ED Medications - No data to display   Initial Impression / Assessment and Plan / ED Course  I have reviewed the triage vital signs and the nursing notes.  Pertinent labs & imaging results that  were available during my care of the patient were reviewed by me and considered in my medical decision making (see chart for details).     BP (!) 165/114 (BP Location: Left Arm)   Pulse 93   Temp 99.4 F (37.4 C) (Oral)   Resp 17   SpO2 99%    Final Clinical Impressions(s) / ED Diagnoses   Final diagnoses:  Penile fracture, initial encounter    New Prescriptions New Prescriptions   No medications on file   2:09 PM Patient with an apparent penile fracture which happened approximately 2-3 days ago. He is able to urinate. He'll benefit from further management by urologist. Initially he was noted to have high blood pressure with systolic 190s. No known history of hypertension. This is likely secondary to pain.  BP did improves once pain is more controlled. He received clonidine  and ultracet PTA.    2:23 PM Appreciate consultation from Urologist Dr. Berneice Heinrich who request pt to be NPO, obtain US of penis and scrotum and he will see pt in the ER after 4pm.  Pt's last meal was for breakfast at 5:30am today.    4:09 PM Scrotal and penis ultrasound demonstrated a 4 cm hematoma within the proximal penile shaft suspicious for fracture although the tunica defect is difficult to determine. Testicle appears normal. This finding is consistence with patient present complaint.  Pt currently awaits urology for further management.  Care discussed with Dr. Ranae Palms who will continue to monitor pt.    4:39 PM Urologist Dr. Berneice Heinrich has seen and evaluated pt and will take him to the OR for surgery.     [name], PA-C 06/30/16 1639    Gerhard Munch, MD 07/01/16 1025

## 2016-06-30 NOTE — ED Notes (Addendum)
Pt reports wed night/thurs morning he woke up with an erection, "flipped over onto my stomach and it bent.  I heard it popped.  I have swelling" in my scrotum, penis, and up "into my lower belly on the right."  Pt denies blood in his urine, pain or burning with urination, denies penile discharge.

## 2016-06-30 NOTE — H&P (Signed)
Victor Briggs is an 31 y.o. male.    Chief Complaint: Penile Swelling / Injury  HPI:   1 - Penile Swelling / Injury - pt with acute onset of penile pain ans swelling with erect penis that became flaccid while at jail. No erections since. Injur >24 hours ago. [place] confirms large penoscrotal hematoma, no overt tunical injury noted.  Nothing to eat for 8 hours. No high fevers. Able to void w/o hematuria.  PMH Unremarkable. No CV disease / blood thinners. No prior surgery.   Today "Tetrault" is seen for emergent evaluatio nof above.   History reviewed. No pertinent past medical history.  History reviewed. No pertinent surgical history.  History reviewed. No pertinent family history. Social History:  reports that he has been smoking Cigarettes.  He does not have any smokeless tobacco history on file. He reports that he does not drink alcohol or use drugs.  Allergies: No Known Allergies   (Not in a hospital admission)  Results for orders placed or performed during the hospital encounter of 06/30/16 (from the past 48 hour(s))  Rapid HIV screen (HIV 1/2 Ab+Ag)     Status: None   Collection Time: 06/30/16  1:55 PM  Result Value Ref Range   HIV-1 P24 Antigen - HIV24 NON REACTIVE NON REACTIVE   HIV 1/2 Antibodies NON REACTIVE NON REACTIVE   Interpretation (HIV Ag Ab)      A non reactive test result means that HIV 1 or HIV 2 antibodies and HIV 1 p24 antigen were not detected in the specimen.   US Scrotum  Result Date: 06/30/2016 CLINICAL DATA:  31 year old male with right testicle and penile pain following injury 2 days ago. EXAM: ULTRASOUND OF SCROTUM TECHNIQUE: Complete ultrasound examination of the testicles, epididymis, other scrotal structures and penis was performed. COMPARISON:  None. FINDINGS: Right testicle Measurements: 4.6 x 3.2 x 3.9 cm. No mass or microlithiasis visualized. Left testicle Measurements: 4.3 x 2 x 3 cm. No mass or microlithiasis visualized. Right epididymis:  Normal in  size and appearance. Left epididymis:  Normal in size and appearance. Hydrocele:  A small right hydrocele is noted. Varicocele:  None visualized. Evaluation of the penis demonstrates a 4 x 3 x 4.4 cm hematoma within the proximal penile shaft. It is difficult to determine tunica integrity on this study. The remainder the penis is unremarkable. IMPRESSION: 4 cm hematoma within the proximal penile shaft suspicious for fracture although a tunica defect is difficult to determine on this study. No abnormality identified within the mid or distal penile shaft. Normal testicles bilaterally. Critical Value/emergent results were called by telephone at the time of interpretation on 06/30/2016 at 3:40 pm to [facility] , who verbally acknowledged these results. Electronically Signed   By: Harmon Pier M.D.   On: 06/30/2016 15:46   [place] Art/ven Flow Abd Pelv Doppler  Result Date: 06/30/2016 CLINICAL DATA:  31 year old male with right testicle and penile pain following injury 2 days ago. EXAM: ULTRASOUND OF SCROTUM TECHNIQUE: Complete ultrasound examination of the testicles, epididymis, other scrotal structures and penis was performed. COMPARISON:  None. FINDINGS: Right testicle Measurements: 4.6 x 3.2 x 3.9 cm. No mass or microlithiasis visualized. Left testicle Measurements: 4.3 x 2 x 3 cm. No mass or microlithiasis visualized. Right epididymis:  Normal in size and appearance. Left epididymis:  Normal in size and appearance. Hydrocele:  A small right hydrocele is noted. Varicocele:  None visualized. Evaluation of the penis demonstrates a 4 x 3 x 4.4 cm hematoma  within the proximal penile shaft. It is difficult to determine tunica integrity on this study. The remainder the penis is unremarkable. IMPRESSION: 4 cm hematoma within the proximal penile shaft suspicious for fracture although a tunica defect is difficult to determine on this study. No abnormality identified within the mid or distal penile shaft. Normal testicles  bilaterally. Critical Value/emergent results were called by telephone at the time of interpretation on 06/30/2016 at 3:40 pm [facility] , who verbally acknowledged these results. Electronically Signed   By: Harmon PierJeffrey  Hu M.D.   On: 06/30/2016 15:46    Review of Systems  Constitutional: Negative.   HENT: Negative.   Eyes: Negative.   Respiratory: Negative.   Cardiovascular: Negative.   Gastrointestinal: Negative.   Musculoskeletal: Negative.   Skin: Negative.   Neurological: Negative.   Endo/Heme/Allergies: Negative.   Psychiatric/Behavioral: Negative.     Blood pressure (!) 165/114, pulse 93, temperature 99.4 F (37.4 C), temperature source Oral, resp. rate 17, SpO2 99 %. Physical Exam  Constitutional: He is oriented to person, place, and time. He appears well-developed.  HENT:  Head: Normocephalic.  Eyes: Pupils are equal, round, and reactive to light.  Neck: Normal range of motion.  Cardiovascular: Normal rate.   Respiratory: Effort normal.  GI: Soft.  Genitourinary:  Genitourinary Comments: Penoscrotal swelling of flaccid penis. Moderately tender.   Musculoskeletal: Normal range of motion.  Neurological: He is alert and oriented to person, place, and time.  Skin: Skin is warm.  Psychiatric: He has a normal mood and affect. His behavior is normal. Thought content normal.     Assessment/Plan  1 - Penile Swelling / Injury - history and exam worrisome for penile fracture v. Emissary vein tear. Rec penile exploration with goal of r/o frature  And repair if necessary, also cysto to r/o urethral injury. Risks, benefits, expected peri-op course with likely overnight observation and DC tomorrow discussed.   Sebastian AcheMANNY, Justus Duerr, MD 06/30/2016, 4:38 PM

## 2016-06-30 NOTE — Transfer of Care (Signed)
Immediate Anesthesia Transfer of Care Note  Patient: Freddy FinnerJeffrey XXXMason  Procedure(s) Performed: Procedure(s): PENILE EXPLORATION WITH REPAIR OF PENILE FX (N/A) CYSTOSCOPY FLEXIBLE (N/A)  Patient Location: PACU  Anesthesia Type:General  Level of Consciousness: awake, alert , oriented and patient cooperative  Airway & Oxygen Therapy: Patient Spontanous Breathing and Patient connected to nasal cannula oxygen  Post-op Assessment: Report given to RN, Post -op Vital signs reviewed and stable and Patient moving all extremities  Post vital signs: Reviewed and stable  Last Vitals:  Vitals:   06/30/16 1357 06/30/16 1848  BP: (!) 165/114 (!) 148/105  Pulse: 93 98  Resp: 17 11  Temp:  36.9 C    Last Pain:  Vitals:   06/30/16 1848  TempSrc:   PainSc: Asleep         Complications: No apparent anesthesia complications

## 2016-06-30 NOTE — Anesthesia Preprocedure Evaluation (Signed)
Anesthesia Evaluation  Patient identified by MRN, date of birth, ID band Patient awake    Reviewed: Allergy & Precautions, NPO status , Patient's Chart, lab work & pertinent test results  History of Anesthesia Complications Negative for: history of anesthetic complications  Airway Mallampati: II  TM Distance: >3 FB Neck ROM: Full    Dental  (+) Poor Dentition   Pulmonary neg shortness of breath, neg sleep apnea, neg COPD, neg recent URI, Current Smoker,    breath sounds clear to auscultation       Cardiovascular hypertension,  Rhythm:Regular  No history of HTN per patient however persistent diastolic htn on admission   Neuro/Psych negative neurological ROS  negative psych ROS   GI/Hepatic negative GI ROS, Neg liver ROS,   Endo/Other  negative endocrine ROS  Renal/GU negative Renal ROS     Musculoskeletal negative musculoskeletal ROS (+)   Abdominal   Peds  Hematology negative hematology ROS (+)   Anesthesia Other Findings   Reproductive/Obstetrics                             Anesthesia Physical Anesthesia Plan  ASA: II  Anesthesia Plan: General   Post-op Pain Management:    Induction: Intravenous  Airway Management Planned: LMA  Additional Equipment: None  Intra-op Plan:   Post-operative Plan: Extubation in OR  Informed Consent: I have reviewed the patients History and Physical, chart, labs and discussed the procedure including the risks, benefits and alternatives for the proposed anesthesia with the patient or authorized representative who has indicated his/her understanding and acceptance.   Dental advisory given  Plan Discussed with: CRNA and Surgeon  Anesthesia Plan Comments:         Anesthesia Quick Evaluation

## 2016-06-30 NOTE — Brief Op Note (Signed)
06/30/2016  6:33 PM  PATIENT:  Victor Briggs  31 y.o. male  PRE-OPERATIVE DIAGNOSIS:  fractured penis  POST-OPERATIVE DIAGNOSIS:  fractured penis  PROCEDURE:  Procedure(s): PENILE EXPLORATION (N/A) CYSTOSCOPY FLEXIBLE (N/A)  SURGEON:  Surgeon(s) and Role:    Sebastian Ache* Tyrion Glaude, MD - Primary  PHYSICIAN ASSISTANT:   ASSISTANTS: none   ANESTHESIA:   general  EBL:  Total I/O In: 1000 [I.V.:1000] Out: -   BLOOD ADMINISTERED:none  DRAINS: none   LOCAL MEDICATIONS USED:  NONE  SPECIMEN:  No Specimen  DISPOSITION OF SPECIMEN:  N/A  COUNTS:  YES  TOURNIQUET:  * No tourniquets in log *  DICTATION: .Other Dictation: Dictation Number [id number]  PLAN OF CARE: Admit for overnight observation  PATIENT DISPOSITION:  PACU - hemodynamically stable.   Delay start of Pharmacological VTE agent (>24hrs) due to surgical blood loss or risk of bleeding: yes

## 2016-06-30 NOTE — Anesthesia Procedure Notes (Signed)
Procedure Name: LMA Insertion Date/Time: 06/30/2016 5:51 PM Performed by: Lewie LoronGERMEROTH, JOHN Pre-anesthesia Checklist: Patient identified, Emergency Drugs available, Suction available and Patient being monitored Patient Re-evaluated:Patient Re-evaluated prior to inductionOxygen Delivery Method: Circle system utilized Preoxygenation: Pre-oxygenation with 100% oxygen Intubation Type: IV induction Ventilation: Mask ventilation without difficulty LMA: LMA inserted LMA Size: 5.0 Tube type: Oral Number of attempts: 1 Placement Confirmation: positive ETCO2 and breath sounds checked- equal and bilateral Tube secured with: Tape Dental Injury: Teeth and Oropharynx as per pre-operative assessment

## 2016-06-30 NOTE — ED Triage Notes (Signed)
Pt arrived in police custody. Reports penile and testicle swelling and pain since wed. Pt is hypertensive at triage, denies hx of same. Was given ultracet and clonidine at 1145.

## 2016-06-30 NOTE — OR Nursing (Signed)
In and out cath done per Dr. Berneice HeinrichManny with 350 cc of concentrated urine obtained.

## 2016-07-01 LAB — RPR: RPR Ser Ql: NONREACTIVE

## 2016-07-01 MED ORDER — SENNA 8.6 MG PO TABS: 1.0000 | ORAL_TABLET | Freq: Every day | ORAL | 0 refills | Status: DC

## 2016-07-01 MED ORDER — TRIPLE ANTIBIOTIC 5-400-5000 EX OINT
TOPICAL_OINTMENT | Freq: Two times a day (BID) | CUTANEOUS | 0 refills | Status: DC
Start: 2016-07-01 — End: 2022-03-26

## 2016-07-01 MED ORDER — SULFAMETHOXAZOLE-TRIMETHOPRIM 800-160 MG PO TABS: 1.0000 | ORAL_TABLET | Freq: Two times a day (BID) | ORAL | 0 refills | Status: DC

## 2016-07-01 MED ORDER — TRAMADOL HCL 50 MG PO TABS: 50.0000 mg | ORAL_TABLET | Freq: Four times a day (QID) | ORAL | 0 refills | Status: AC | PRN

## 2016-07-01 NOTE — Progress Notes (Signed)
Patient admited from ED in police custody. Two officers present at this time.

## 2016-07-01 NOTE — Discharge Summary (Signed)
Physician Discharge Summary  Patient ID: Victor Briggs: [id number] DOB/AGE: 12-Sep-1985 31 y.o.  Admit date: 06/30/2016 Discharge date: 07/01/2016  Admission Diagnoses:  Discharge Diagnoses:  Active Problems:   Penile fracture   Discharged Condition: good  Hospital Course:   1 - Penile Fracture - pt underwent urgent cystoscopy with penile exploration and repair of fracture on 5/18, the day of admission, without acute complication. He was observed overnight. By the AM of POD 1 he is ambulatory, pain conrolled on PO meds, voiding, and felt to be adequate for discharge.   Consults: None  Significant Diagnostic Studies: none  Treatments: surgery: as per above.   Discharge Exam: Blood pressure 138/79, pulse 80, temperature 98.2 F (36.8 C), temperature source Oral, resp. rate 15, height 6\' 3"  (1.905 m), weight 87.1 kg (192 lb), SpO2 100 %. General appearance: alert, cooperative, appears stated age and two officers at bedside.  Eyes: negative Nose: Nares normal. Septum midline. Mucosa normal. No drainage or sinus tenderness. Throat: lips, mucosa, and tongue normal; teeth and gums normal Neck: supple, symmetrical, trachea midline Back: symmetric, no curvature. ROM normal. No CVA tenderness. Resp: non-labored on room air.  Cardio: Nl rate GI: soft, non-tender; bowel sounds normal; no masses,  no organomegaly Male genitalia: penile dressing taken down. circumferrential incision site c/d/i with mild swelling as expected that is improved from pre-op.  Extremities: extremities normal, atraumatic, no cyanosis or edema Pulses: 2+ and symmetric Skin: Skin color, texture, turgor normal. No rashes or lesions Lymph nodes: Cervical, supraclavicular, and axillary nodes normal. Neurologic: Grossly normal Incision/Wound: as per above  Disposition: 01-Home or Self Care   Allergies as of 07/01/2016   No Known Allergies     Medication List    STOP taking these medications    amoxicillin 500 MG capsule Commonly known as:  AMOXIL   doxycycline 100 MG capsule Commonly known as:  VIBRAMYCIN   HYDROcodone-acetaminophen 5-325 MG tablet Commonly known as:  NORCO   ibuprofen 400 MG tablet Commonly known as:  ADVIL,MOTRIN   naproxen 500 MG tablet Commonly known as:  NAPROSYN     TAKE these medications   methocarbamol 500 MG tablet Commonly known as:  ROBAXIN Take 1 tablet (500 mg total) by mouth 2 (two) times daily.   neomycin-bacitracin-polymyxin [phone] ointment Apply topically 2 (two) times daily. Until stitches completely dissolved   senna 8.6 MG Tabs tablet Commonly known as:  SENOKOT Take 1 tablet (8.6 mg total) by mouth daily. While taking pain meds to prevent constipation.   sulfamethoxazole-trimethoprim 800-160 MG tablet Commonly known as:  BACTRIM DS,SEPTRA DS Take 1 tablet by mouth 2 (two) times daily. X 5 days to prevent post-op infection.   traMADol 50 MG tablet Commonly known as:  ULTRAM Take 1-2 tablets (50-100 mg total) by mouth every 6 (six) hours as needed for moderate pain or severe pain. Post-operatively      Follow-up Information    Sebastian AcheManny, Toron Bowring, MD Follow up.   Specialty:  Urology Why:  Office will call to arrange follow up visit in 3-4 weeks for wound check.  Contact information: [address] [place] [address] [phone]           Signed: Sebastian AcheMANNY, Nassim Cosma 07/01/2016, 8:27 AM

## 2016-07-01 NOTE — Care Management Note (Signed)
Case Management Note  Patient Details  Name: Victor Briggs MRN: [id number] Date of Birth: 10-Jul-1985  Subjective/Objective:                 Per notes patient from correction facility and will return at DC. DC order in place. No CM consults, orders or needs identified at this time.    Action/Plan:  DC to corrections facility.  Expected Discharge Date:  07/01/16               Expected Discharge Plan:  Corrections Facility  In-House Referral:     Discharge planning Services  CM Consult  Post Acute Care Choice:    Choice offered to:     DME Arranged:    DME Agency:     HH Arranged:    HH Agency:     Status of Service:  Completed, signed off  If discussed at [employer] Meetings, dates discussed:    Additional Comments:  Victor SabalDebbie Aidenn Skellenger, RN 07/01/2016, 8:44 AM

## 2016-07-01 NOTE — Discharge Instructions (Signed)
1 - You may swelling of the penis and scrotum for up to 2 weeks. This is normal.  2 - Limit strong physical and all sexual activity x 2 weeks.   3 - Call MD or go to ER for fever >102, severe pain / nausea / vomiting not relieved by medications, or acute change in medical status

## 2016-07-03 ENCOUNTER — Encounter (HOSPITAL_COMMUNITY): Payer: Self-pay | Admitting: Urology

## 2016-07-03 LAB — GC/CHLAMYDIA PROBE AMP (~~LOC~~) NOT AT ARMC
Chlamydia: NEGATIVE
NEISSERIA GONORRHEA: NEGATIVE

## 2016-07-03 NOTE — Op Note (Signed)
[name], [name]               ACCOUNT NO.:  [id number]  MEDICAL RECORD NO.:  [id number]  LOCATION:                                 FACILITY:  PHYSICIAN:  Sebastian Acheheodore Zimir Kittleson, MD          DATE OF BIRTH:  DATE OF PROCEDURE:  06/30/2016                               OPERATIVE REPORT   DIAGNOSIS:  Fractured penis.  PROCEDURE: 1. Cystoscopy. 2. Penile exploration with fractured penis repair.  ESTIMATED BLOOD LOSS:  Approximately 20 mL of old formed blood.  Minimal new active bleeding.  COMPLICATION:  None.  SPECIMEN:  None.  FINDINGS: 1. Significant penoscrotal hematoma. 2. Small penile fracture on left lateral penis midshaft tunical defect     approximately 8 mm. 3. Unremarkable cystourethroscopy. 4. Excellent achievement of artificial erection following fracture     repair.  INDICATION:  [name] is a 31 year old gentleman, who had a penile injury he states approximately 2 days ago where he had an erect penis at that time, he had immediate detumescence with onset of pain.  He has had swelling since this time and unable to achieve tumescence.  This is highly concerning for likely penile fracture versus very large emissary vein tear.  He is evaluated in the emergency room today where both exam and imaging history were concerning for penile fracture.  It is felt that routine exploration was clearly warranted.  Informed consent was obtained and placed in the medical record.  PROCEDURE IN DETAIL:  The patient being Victor Briggs was verified. Procedure being penile exploration with repair was confirmed.  Procedure was carried out.  Time-out was performed.  Intravenous antibiotics were administered.  General LMA anesthesia introduced.  The patient was into a supine position.  Sterile field was created by prepping and draping the patient's penis, perineum, proximal thighs, and scrotum using iodine.  Next, cystourethroscopy was performed using a 16-French flexible cystoscope.   Inspection of the anterior and posterior urethra unremarkable.  Inspection of bladder revealed no diverticula, calcifications, papillary lesions.  Notably, there was no evidence of urethral injury whatsoever.  The cystoscope was then removed.  Next, a circumcision type incision was made approximately 1.5 cm proximal to the corona of the glans circumferentially down to the tunica which was then very carefully swept away from the overlying very edematous penile shaft tissue, and this was carefully inspected down to the base of the penis. In the left mid shaft, there was a tunical defect approximately 8 mm with maximal point of hematoma and this was both visible and palpable. This did appear amenable to simple repair.  As such, interrupted 3-0 Ethibond was used to close the defect using interrupted technique x4. This completely reapproximated the defect.  A conversion drain was then placed at the base of the penis.  Artificial erection was obtained by instilling approximately 80 mL of sterile saline via 18-gauge needle. This resulted in excellent achievement of artificial erection.  No evidence of significant bending or twisting.  No additional defects were noted.  The area of needle site for this direction was then oversewn with figure-of-eight Ethibond.  Additional point coagulation current was used for excellent hemostasis and the  penile skin was reapproximated using interrupted Vicryl at 6 o'clock and 12 o'clock position with two separate running suture lines in between, resulted in excellent penile skin reapproximation.  Bladder was emptied via in and out catheterization and a dressing of Kling and Coban was applied. Procedure was terminated.  The patient tolerated the procedure well.  No immediate periprocedural complications.  The patient was taken to the postanesthesia care in stable condition with plan for observation and admission.    ______________________________ Sebastian Ache, MD   ______________________________ Sebastian Ache, MD    TM/MEDQ  D:  06/30/2016  T:  06/30/2016  Job:  [id number]

## 2016-07-04 ENCOUNTER — Emergency Department (HOSPITAL_COMMUNITY)
Admission: EM | Admit: 2016-07-04 | Discharge: 2016-07-04 | Disposition: A | Discharge status: Home / Self Care | Payer: Self-pay | Attending: Physician Assistant | Admitting: Physician Assistant

## 2016-07-04 ENCOUNTER — Encounter (HOSPITAL_COMMUNITY): Payer: Self-pay | Admitting: Emergency Medicine

## 2016-07-04 DIAGNOSIS — Z79899 Other long term (current) drug therapy: Secondary | ICD-10-CM | POA: Insufficient documentation

## 2016-07-04 DIAGNOSIS — F17210 Nicotine dependence, cigarettes, uncomplicated: Secondary | ICD-10-CM | POA: Insufficient documentation

## 2016-07-04 DIAGNOSIS — N3000 Acute cystitis without hematuria: Secondary | ICD-10-CM | POA: Insufficient documentation

## 2016-07-04 LAB — CBC WITH DIFFERENTIAL/PLATELET
BASOS ABS: 0 10*3/uL (ref 0.0–0.1)
BASOS PCT: 0 %
EOS ABS: 0 10*3/uL (ref 0.0–0.7)
Eosinophils Relative: 1 %
HCT: 48.6 % (ref 39.0–52.0)
HEMOGLOBIN: 16.5 g/dL (ref 13.0–17.0)
LYMPHS ABS: 1 10*3/uL (ref 0.7–4.0)
Lymphocytes Relative: 12 %
MCH: 28 pg (ref 26.0–34.0)
MCHC: 34 g/dL (ref 30.0–36.0)
MCV: 82.5 fL (ref 78.0–100.0)
Monocytes Absolute: 1.4 10*3/uL — ABNORMAL HIGH (ref 0.1–1.0)
Monocytes Relative: 16 %
NEUTROS PCT: 71 %
Neutro Abs: 6.3 10*3/uL (ref 1.7–7.7)
Platelets: 266 10*3/uL (ref 150–400)
RBC: 5.89 MIL/uL — ABNORMAL HIGH (ref 4.22–5.81)
RDW: 14.4 % (ref 11.5–15.5)
WBC: 8.8 10*3/uL (ref 4.0–10.5)

## 2016-07-04 LAB — URINALYSIS, ROUTINE W REFLEX MICROSCOPIC
Bilirubin Urine: NEGATIVE
GLUCOSE, UA: NEGATIVE mg/dL
HGB URINE DIPSTICK: NEGATIVE
Ketones, ur: NEGATIVE mg/dL
NITRITE: NEGATIVE
Protein, ur: 30 mg/dL — AB
SPECIFIC GRAVITY, URINE: 1.014 (ref 1.005–1.030)
pH: 5 (ref 5.0–8.0)

## 2016-07-04 LAB — COMPREHENSIVE METABOLIC PANEL
ALK PHOS: 85 U/L (ref 38–126)
ALT: 22 U/L (ref 17–63)
AST: 21 U/L (ref 15–41)
Albumin: 5.1 g/dL — ABNORMAL HIGH (ref 3.5–5.0)
Anion gap: 10 (ref 5–15)
BUN: 11 mg/dL (ref 6–20)
CALCIUM: 10 mg/dL (ref 8.9–10.3)
CO2: 27 mmol/L (ref 22–32)
CREATININE: 1.07 mg/dL (ref 0.61–1.24)
Chloride: 97 mmol/L — ABNORMAL LOW (ref 101–111)
Glucose, Bld: 129 mg/dL — ABNORMAL HIGH (ref 65–99)
Potassium: 4.3 mmol/L (ref 3.5–5.1)
Sodium: 134 mmol/L — ABNORMAL LOW (ref 135–145)
Total Bilirubin: 1.1 mg/dL (ref 0.3–1.2)
Total Protein: 9.6 g/dL — ABNORMAL HIGH (ref 6.5–8.1)

## 2016-07-04 MED ORDER — CEPHALEXIN 500 MG PO CAPS: 500.0000 mg | ORAL_CAPSULE | Freq: Four times a day (QID) | ORAL | 0 refills | Status: DC

## 2016-07-04 MED ORDER — ONDANSETRON HCL 4 MG/2ML IJ SOLN
4.0000 mg | Freq: Once | INTRAMUSCULAR | Status: AC
  Administered 2016-07-04: 4 mg via INTRAVENOUS
  Filled 2016-07-04: qty 2

## 2016-07-04 MED ORDER — CEPHALEXIN 500 MG PO CAPS
500.0000 mg | ORAL_CAPSULE | Freq: Once | ORAL | Status: AC
  Administered 2016-07-04: 500 mg via ORAL
  Filled 2016-07-04: qty 1

## 2016-07-04 MED ORDER — SODIUM CHLORIDE 0.9 % IV BOLUS (SEPSIS)
1000.0000 mL | Freq: Once | INTRAVENOUS | Status: AC
  Administered 2016-07-04: 1000 mL via INTRAVENOUS

## 2016-07-04 NOTE — ED Notes (Signed)
Provided patient water with providers permission.

## 2016-07-04 NOTE — ED Notes (Signed)
Pt attempted to urinate but was unable tp obtain sample.

## 2016-07-04 NOTE — ED Notes (Signed)
[facility].

## 2016-07-04 NOTE — ED Notes (Signed)
Patient ambulated 60 feet with no assistance or problems. Patient's heart rate at the beginning of the ambulation was at 88 beats per minute. At the end of the ambulation patient's heart rate was 95 beats per minute. Patient O2 saturation was 97% on room air at the beginning of the ambulation and was at 95% at the end of the ambulation.

## 2016-07-04 NOTE — ED Triage Notes (Signed)
Per patient, states he was place on hypertensive medication in jail yesterday- states he broke his penis and had surgery last week-is taking tramadol and an antibiotic-denies pain

## 2016-07-04 NOTE — ED Provider Notes (Signed)
[facility]-EMERGENCY DEPT Provider Note   CSN: [id number] Arrival date & time: 07/04/16  1635     History   Chief Complaint Chief Complaint  Patient presents with  . Hypertension    HPI Victor Briggs is a 31 y.o. male.  HPI   Patient's 31 year old male presenting with feelings of his heart beating quickly and hypertension. Patient had recent surgery done 3 days ago. Patient had penile fracture that was repaired by urology. He reports that it's been healing well and swelling has been decreasing. Today he was started on new hypertensive by jail physiician.  He reports that today he felt jittery, chills fever. Patient recently started on 2 antihypertensives, Norvasc and HCTZ.  History reviewed. No pertinent past medical history.  Patient Active Problem List   Diagnosis Date Noted  . Penile fracture 06/30/2016    Past Surgical History:  Procedure Laterality Date  . CYSTOSCOPY N/A 06/30/2016   Procedure: CYSTOSCOPY FLEXIBLE;  Surgeon: Sebastian Ache, MD;  [facility]: [facility] OR;  Service: Urology;  Laterality: N/A;  . SCROTAL EXPLORATION N/A 06/30/2016   Procedure: PENILE EXPLORATION WITH REPAIR OF PENILE FX;  Surgeon: Sebastian Ache, MD;  Location: [facility] OR;  Service: Urology;  Laterality: N/A;       Home Medications    Prior to Admission medications   Medication Sig Start Date End Date Taking? Authorizing Provider  neomycin-bacitracin-polymyxin (NEOSPORIN) [phone] ointment Apply topically 2 (two) times daily. Until stitches completely dissolved 07/01/16  Yes Sebastian Ache, MD  senna (SENOKOT) 8.6 MG TABS tablet Take 1 tablet (8.6 mg total) by mouth daily. While taking pain meds to prevent constipation. 07/01/16  Yes Sebastian Ache, MD  sulfamethoxazole-trimethoprim (BACTRIM DS,SEPTRA DS) 800-160 MG tablet Take 1 tablet by mouth 2 (two) times daily. X 5 days to prevent post-op infection. 07/01/16  Yes Sebastian Ache, MD  traMADol (ULTRAM) 50 MG tablet Take 1-2 tablets (50-100 mg total)  by mouth every 6 (six) hours as needed for moderate pain or severe pain. Post-operatively 07/01/16 07/01/17 Yes Sebastian Ache, MD  methocarbamol (ROBAXIN) 500 MG tablet Take 1 tablet (500 mg total) by mouth 2 (two) times daily. Patient not taking: Reported on 06/30/2016 11/22/14   [name], PA-C    Family History No family history on file.  Social History Social History  Substance Use Topics  . Smoking status: Current Every Day Smoker    Types: Cigarettes  . Smokeless tobacco: Not on file  . Alcohol use No     Allergies   Patient has no known allergies.   Review of Systems Review of Systems  Constitutional: Negative for activity change.  Respiratory: Negative for shortness of breath.   Cardiovascular: Negative for chest pain.  Gastrointestinal: Negative for abdominal pain.  Genitourinary: Positive for penile pain. Negative for decreased urine volume, discharge, hematuria, penile swelling, scrotal swelling, testicular pain and urgency.     Physical Exam Updated Vital Signs BP (!) 161/116 (BP Location: Right Arm)   Pulse (!) 105   Temp 98.5 F (36.9 C) (Oral)   Resp 18   SpO2 100%   Physical Exam  Constitutional: He is oriented to person, place, and time. He appears well-nourished.  HENT:  Head: Normocephalic.  Eyes: Conjunctivae are normal. Right eye exhibits no discharge. Left eye exhibits no discharge.  Cardiovascular: Normal rate and regular rhythm.   No murmur heard. Pulmonary/Chest: Effort normal and breath sounds normal. No respiratory distress.  Abdominal: Soft. He exhibits no distension.  Neurological: He is oriented to person, place,  and time.  Skin: Skin is warm and dry. He is not diaphoretic.  Psychiatric: He has a normal mood and affect. His behavior is normal.     ED Treatments / Results  Labs (all labs ordered are listed, but only abnormal results are displayed) Labs Reviewed  CBC WITH DIFFERENTIAL/PLATELET  URINALYSIS, ROUTINE W REFLEX  MICROSCOPIC  COMPREHENSIVE METABOLIC PANEL    EKG  EKG Interpretation None       Radiology No results found.  Procedures Procedures (including critical care time)  Medications Ordered in ED Medications - No data to display   Initial Impression / Assessment and Plan / ED Course  I have reviewed the triage vital signs and the nursing notes.  Pertinent labs & imaging results that were available during my care of the patient were reviewed by me and considered in my medical decision making (see chart for details).     Patient's 31 year old male with recent penile fracture surgery 3 days ago. Patient's been in jail since March. He reports that last days but felt jittery hot and chills. Concern for infection secondary to surgical procedure. We'll get UA, labs. Jail sent him here because he was also originally started on antihypertensives. Doubt this is a side effect of those. We'll make sure the renal function and electrolytes are normal.  UA shows infection after recent procedure. .   Will discharge with abx.  Wil l have him continue HTN meds under Dr at jail.   Final Clinical Impressions(s) / ED Diagnoses   Final diagnoses:  None    New Prescriptions New Prescriptions   No medications on file     Abelino DerrickMackuen, Aqua Denslow Lyn, MD 07/04/16 2057

## 2016-07-04 NOTE — Discharge Instructions (Signed)
U had a recent procedure done by urology. Urine now shows evidence of infection. Please take antibiotics prescribed and follow up with urology as an outpatient.  Your renal function is good here please  continue to have your hypertension managed as an outpatient.

## 2016-07-04 NOTE — ED Notes (Signed)
Asked patient to urinate for a urine sample but he didn't want to urinate. Pt resting quietly.

## 2017-09-26 ENCOUNTER — Encounter (HOSPITAL_COMMUNITY): Payer: Self-pay

## 2017-09-26 ENCOUNTER — Other Ambulatory Visit: Payer: Self-pay

## 2017-09-26 ENCOUNTER — Emergency Department (HOSPITAL_COMMUNITY)
Admission: EM | Admit: 2017-09-26 | Discharge: 2017-09-26 | Disposition: A | Discharge status: Home / Self Care | Attending: Emergency Medicine | Admitting: Emergency Medicine

## 2017-09-26 DIAGNOSIS — F17210 Nicotine dependence, cigarettes, uncomplicated: Secondary | ICD-10-CM | POA: Insufficient documentation

## 2017-09-26 DIAGNOSIS — I10 Essential (primary) hypertension: Secondary | ICD-10-CM | POA: Insufficient documentation

## 2017-09-26 DIAGNOSIS — R369 Urethral discharge, unspecified: Secondary | ICD-10-CM

## 2017-09-26 LAB — COMPREHENSIVE METABOLIC PANEL
ALT: 19 U/L (ref 0–44)
AST: 25 U/L (ref 15–41)
Albumin: 4.1 g/dL (ref 3.5–5.0)
Alkaline Phosphatase: 66 U/L (ref 38–126)
Anion gap: 8 (ref 5–15)
BILIRUBIN TOTAL: 0.8 mg/dL (ref 0.3–1.2)
BUN: 10 mg/dL (ref 6–20)
CO2: 25 mmol/L (ref 22–32)
CREATININE: 0.87 mg/dL (ref 0.61–1.24)
Calcium: 9.4 mg/dL (ref 8.9–10.3)
Chloride: 107 mmol/L (ref 98–111)
GFR calc Af Amer: >60 mL/min (ref >60)
Glucose, Bld: 105 mg/dL — ABNORMAL HIGH (ref 70–99)
Potassium: 3.8 mmol/L (ref 3.5–5.1)
Sodium: 140 mmol/L (ref 135–145)
Total Protein: 7.7 g/dL (ref 6.5–8.1)

## 2017-09-26 LAB — URINALYSIS, ROUTINE W REFLEX MICROSCOPIC
Bilirubin Urine: NEGATIVE
Glucose, UA: NEGATIVE mg/dL
Ketones, ur: NEGATIVE mg/dL
Nitrite: NEGATIVE
Protein, ur: NEGATIVE mg/dL
Specific Gravity, Urine: 1.017 (ref 1.005–1.030)
WBC, UA: >50 WBC/hpf — ABNORMAL HIGH (ref 0–5)
pH: 5 (ref 5.0–8.0)

## 2017-09-26 LAB — CBC
HEMATOCRIT: 44.6 % (ref 39.0–52.0)
Hemoglobin: 14.1 g/dL (ref 13.0–17.0)
MCH: 26.7 pg (ref 26.0–34.0)
MCHC: 31.6 g/dL (ref 30.0–36.0)
MCV: 84.3 fL (ref 78.0–100.0)
Platelets: 181 10*3/uL (ref 150–400)
RBC: 5.29 MIL/uL (ref 4.22–5.81)
RDW: 14.3 % (ref 11.5–15.5)
WBC: 4.9 10*3/uL (ref 4.0–10.5)

## 2017-09-26 LAB — CHOLESTEROL, TOTAL: CHOLESTEROL: 149 mg/dL (ref 0–200)

## 2017-09-26 MED ORDER — CEFTRIAXONE SODIUM 250 MG IJ SOLR
250.0000 mg | Freq: Once | INTRAMUSCULAR | Status: AC
  Administered 2017-09-26: 250 mg via INTRAMUSCULAR
  Filled 2017-09-26: qty 250

## 2017-09-26 MED ORDER — STERILE WATER FOR INJECTION IJ SOLN
INTRAMUSCULAR | Status: AC
  Administered 2017-09-26: 0.9 mL
  Filled 2017-09-26: qty 10

## 2017-09-26 MED ORDER — AZITHROMYCIN 250 MG PO TABS
1000.0000 mg | ORAL_TABLET | Freq: Once | ORAL | Status: AC
Start: 2017-09-26 — End: 2017-09-26
  Administered 2017-09-26: 1000 mg via ORAL
  Filled 2017-09-26: qty 4

## 2017-09-26 NOTE — ED Provider Notes (Signed)
Patient placed in Quick Look pathway, seen and evaluated   Chief Complaint: STD symptoms  HPI:  Victor SagoJeffrey Briggs is a [age] y.o. male who presents to the  ED for an STD check. [facility]. Patient was not tested or treated there. He was told to get blood test and STD screening. Patient was referred to another location but did not go there.   ROS: GU: discharge from penis  Physical Exam:  BP (!) 197/128 (BP Location: Right Arm)   Pulse 84   Temp 99.4 F (37.4 C) (Oral)   Resp 18   Ht 6\' 3"  (1.905 m)   Wt 87 kg   SpO2 99%   BMI 23.97 kg/m     Gen: No distress  Neuro: Awake and Alert  Skin: Warm and dry    Initiation of care has begun. The patient has been counseled on the process, plan, and necessity for staying for the completion/evaluation, and the remainder of the medical screening examination    Janne Napoleoneese, Hope M, NP 09/26/17 1343    Vanetta MuldersZackowski, Scott, MD 09/28/17 [phone]

## 2017-09-26 NOTE — ED Provider Notes (Addendum)
[facility]ENC[facility] Provider Note   CSN: [id number] Arrival date & time: 09/26/17  1312     History   Chief Complaint Chief Complaint  Patient presents with  . SEXUALLY TRANSMITTED DISEASE    HPI Victor Briggs is a 32 y.o. male.  HPI   32 year old male presents today with complaints of penile discharge. Patient reports he is sexually active with one male partner. He notes over the last week she's had penile discharge. Patient denies any fever, nausea, vomiting, abdominal pain. Patient noted to be hypertensive here, reports that he has taken blood pressure medication the past, but has had reactions to it. He denies any associated complaints including chest pain abdominal pain, headache or neurological deficits.    History reviewed. No pertinent past medical history.  Patient Active Problem List   Diagnosis Date Noted  . Penile fracture 06/30/2016    Past Surgical History:  Procedure Laterality Date  . CYSTOSCOPY N/A 06/30/2016   Procedure: CYSTOSCOPY FLEXIBLE;  Surgeon: Victor AcheManny, Theodore, MD;  [facility];  Service: Urology;  Laterality: N/A;  . SCROTAL EXPLORATION N/A 06/30/2016   Procedure: PENILE EXPLORATION WITH REPAIR OF PENILE FX;  Surgeon: Victor AcheManny, Theodore, MD;  [facility];  Service: Urology;  Laterality: N/A;        Home Medications    Prior to Admission medications   Medication Sig Start Date End Date Taking? Authorizing Provider  cephALEXin (KEFLEX) 500 MG capsule Take 1 capsule (500 mg total) by mouth 4 (four) times daily. Patient not taking: Reported on 09/26/2017 07/04/16   Mackuen, Courteney Lyn, MD  methocarbamol (ROBAXIN) 500 MG tablet Take 1 tablet (500 mg total) by mouth 2 (two) times daily. Patient not taking: Reported on 06/30/2016 11/22/14   Victor Helperran, Bowie, PA-C  neomycin-bacitracin-polymyxin (NEOSPORIN) [phone] ointment Apply topically 2 (two) times daily. Until stitches completely dissolved Patient not taking:  Reported on 09/26/2017 07/01/16   Victor AcheManny, Theodore, MD  senna (SENOKOT) 8.6 MG TABS tablet Take 1 tablet (8.6 mg total) by mouth daily. While taking pain meds to prevent constipation. Patient not taking: Reported on 09/26/2017 07/01/16   Victor AcheManny, Theodore, MD  sulfamethoxazole-trimethoprim (BACTRIM DS,SEPTRA DS) 800-160 MG tablet Take 1 tablet by mouth 2 (two) times daily. X 5 days to prevent post-op infection. Patient not taking: Reported on 09/26/2017 07/01/16   Victor AcheManny, Theodore, MD    Family History No family history on file.  Social History Social History   Tobacco Use  . Smoking status: Current Every Day Smoker    Packs/day: 1.00    Types: Cigarettes  . Smokeless tobacco: Never Used  Substance Use Topics  . Alcohol use: No  . Drug use: Yes    Types: Marijuana     Allergies   Patient has no known allergies.   Review of Systems Review of Systems  All other systems reviewed and are negative.    Physical Exam Updated Vital Signs BP (S) (!) 210/128 Comment: pt encouraged to find a primary to get started on BP meds, pt refused to take a prescription from the EDP today  Pulse 80   Temp 98.9 F (37.2 C) (Oral)   Resp 18   Ht 6\' 3"  (1.905 m)   Wt 87 kg   SpO2 100%   BMI 23.97 kg/m   Physical Exam  Constitutional: He is oriented to person, place, and time. He appears well-developed and well-nourished.  HENT:  Head: Normocephalic and atraumatic.  Eyes: Pupils are equal, round, and reactive  to light. Conjunctivae are normal. Right eye exhibits no discharge. Left eye exhibits no discharge. No scleral icterus.  Neck: Normal range of motion. No JVD present. No tracheal deviation present.  Pulmonary/Chest: Effort normal. No stridor.  Neurological: He is alert and oriented to person, place, and time. Coordination normal.  Psychiatric: He has a normal mood and affect. His behavior is normal. Judgment and thought content normal.  Nursing note and vitals reviewed.    ED Treatments  / Results  Labs (all labs ordered are listed, but only abnormal results are displayed) Labs Reviewed  COMPREHENSIVE METABOLIC PANEL - Abnormal; Notable for the following components:      Result Value   Glucose, Bld 105 (*)    All other components within normal limits  URINALYSIS, ROUTINE W REFLEX MICROSCOPIC - Abnormal; Notable for the following components:   APPearance CLOUDY (*)    Hgb urine dipstick SMALL (*)    Leukocytes, UA LARGE (*)    WBC, UA >50 (*)    Bacteria, UA FEW (*)    All other components within normal limits  WET PREP, GENITAL  CBC  CHOLESTEROL, TOTAL  RPR  HIV ANTIBODY (ROUTINE TESTING)  GC/CHLAMYDIA PROBE AMP ([place]) NOT AT [facility]    EKG None  Radiology No results found.  Procedures Procedures (including critical care time)  Medications Ordered in ED Medications  cefTRIAXone (ROCEPHIN) injection 250 mg (250 mg Intramuscular Given 09/26/17 1705)  azithromycin (ZITHROMAX) tablet 1,000 mg (1,000 mg Oral Given 09/26/17 1704)  sterile water (preservative free) injection (0.9 mLs  Given 09/26/17 1705)     Initial Impression / Assessment and Plan / ED Course  I have reviewed the triage vital signs and the nursing notes.  Pertinent labs & imaging results that were available during my care of the patient were reviewed by me and considered in my medical decision making (see chart for details).     32 year old male presents today with likely STD. He'll be treated prophylactically, no complaining features to this. Patient was also noted to be hypertensive here 216/119, he knows that he is supposed be on blood pressure medication but does not take any. He notes he had a bad reaction to previous antihypertensive medication and is concerned about taking it. He has no signs of end organ damage presently. He reports that even if I did write him prescription for medication he would not be taking it. I counseled him on the competitions of long-standing hypertension  and encouraged him to follow up with primary care for management of his hypertension. He verbalized understanding and agreement to today's plan.  Final Clinical Impressions(s) / ED Diagnoses   Final diagnoses:  Penile discharge  Hypertension, unspecified type    ED Discharge Orders    None       Rosalio LoudHedges, Kassem, PA-C 09/26/17 1800    Eyvonne MechanicHedges, Darlene, PA-C 09/26/17 1801    Long, Arlyss RepressJoshua G, MD 09/27/17 [phone]

## 2017-09-26 NOTE — ED Triage Notes (Signed)
Pt states that he [facility] Monday for DC from penis, pt was not swabbed there, was told to come get blood work and tested. Pt was referred to get lab work done from another location but did not fast.

## 2017-09-26 NOTE — Discharge Instructions (Addendum)
Please read attached information. If you experience any new or worsening signs or symptoms please return to the emergency room for evaluation. Please follow-up with your primary care provider or specialist as discussed.  °

## 2017-09-27 LAB — GC/CHLAMYDIA PROBE AMP (~~LOC~~) NOT AT ARMC
Chlamydia: NEGATIVE
Neisseria Gonorrhea: POSITIVE — AB

## 2017-09-27 LAB — RPR: RPR Ser Ql: NONREACTIVE

## 2017-09-29 LAB — RNA QUALITATIVE

## 2017-09-29 LAB — HIV 1/2 AB DIFFERENTIATION
HIV 1 AB: NEGATIVE
HIV 2 AB: NEGATIVE
Note: NEGATIVE

## 2017-09-29 LAB — HIV ANTIBODY (ROUTINE TESTING W REFLEX): HIV Screen 4th Generation wRfx: REACTIVE — AB

## 2019-02-16 IMAGING — US US ART/VEN ABD/PELV/SCROTUM DOPPLER LTD
1 series · 13 of 25 positions shown · non-contrast
Comparison: None.

CLINICAL DATA: 31-year-old male with right testicle and penile pain
following injury 2 days ago.

EXAM:
ULTRASOUND OF SCROTUM
TECHNIQUE: Complete ultrasound examination of the testicles, epididymis, other
scrotal structures and penis was performed.

[Series 1: us art/ven abd/pelv/scrotum doppler ltd · 0.10mm/px · 13 of 83 slices shown]
[im 1/83]
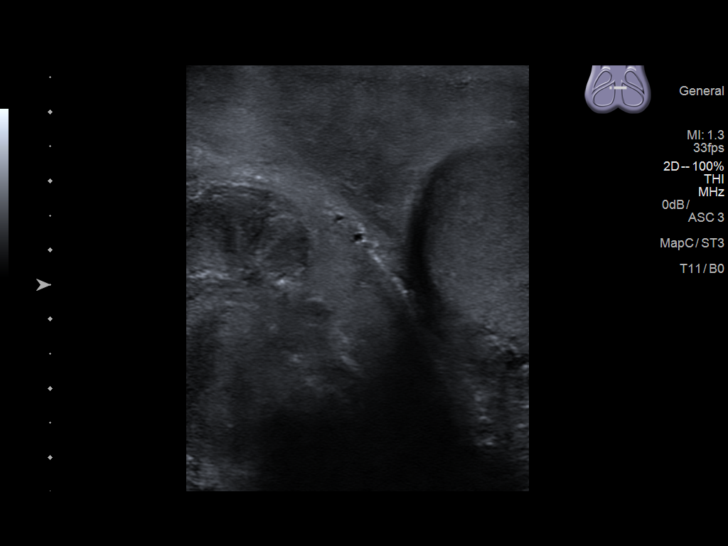
[im 7/83]
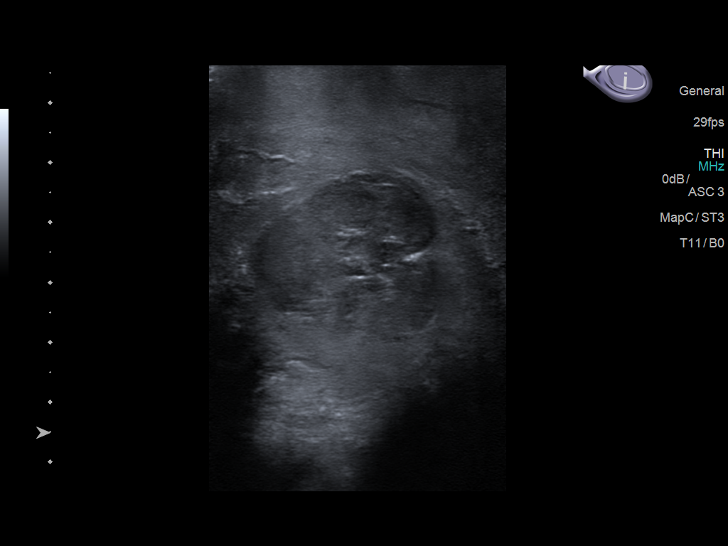
[im 14/83]
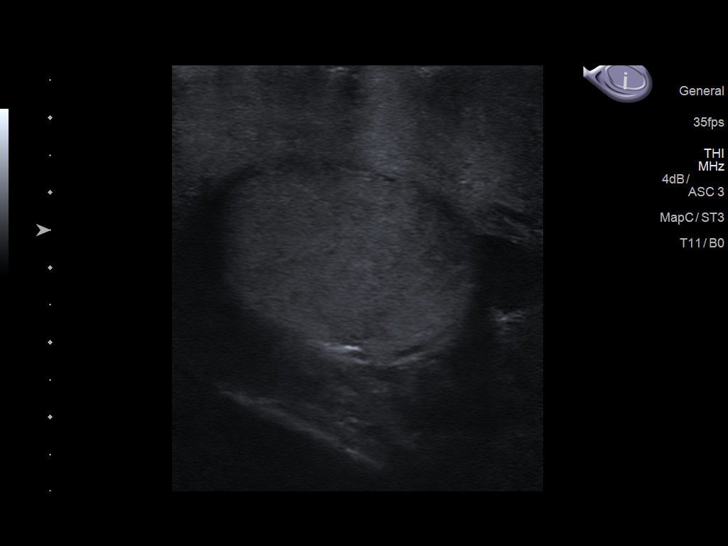
[im 21/83]
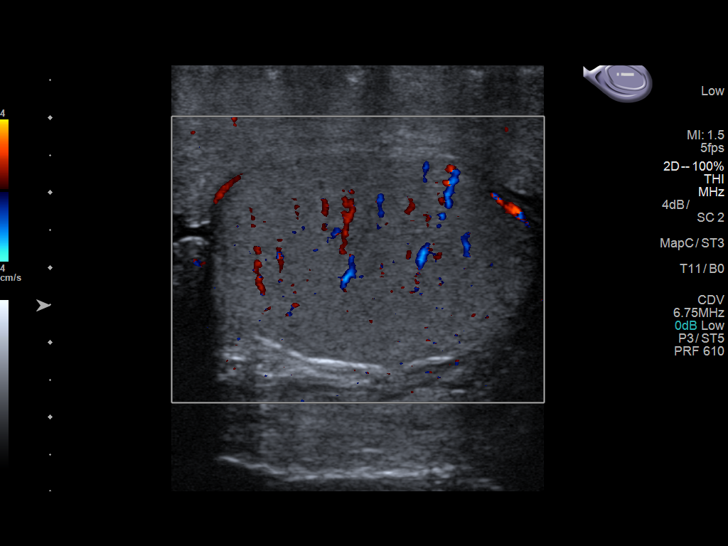
[im 28/83]
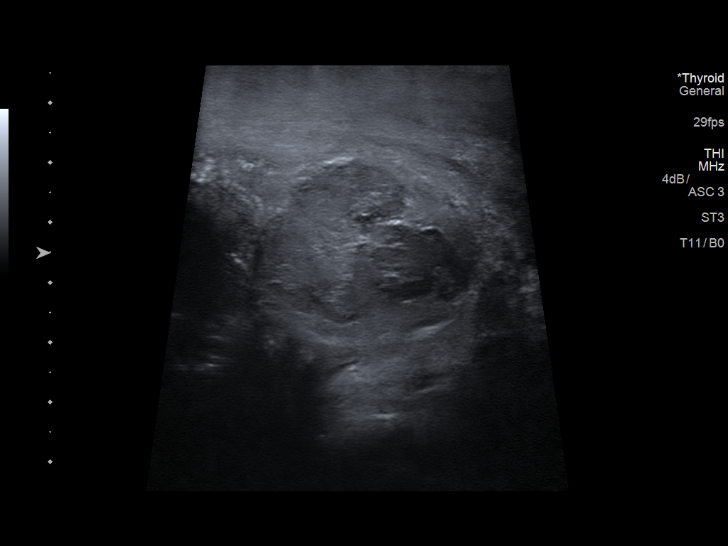
[im 35/83]
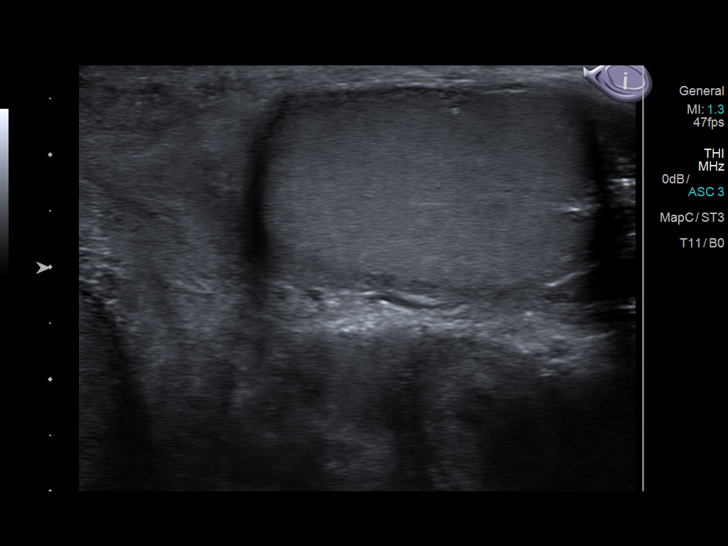
[im 42/83]
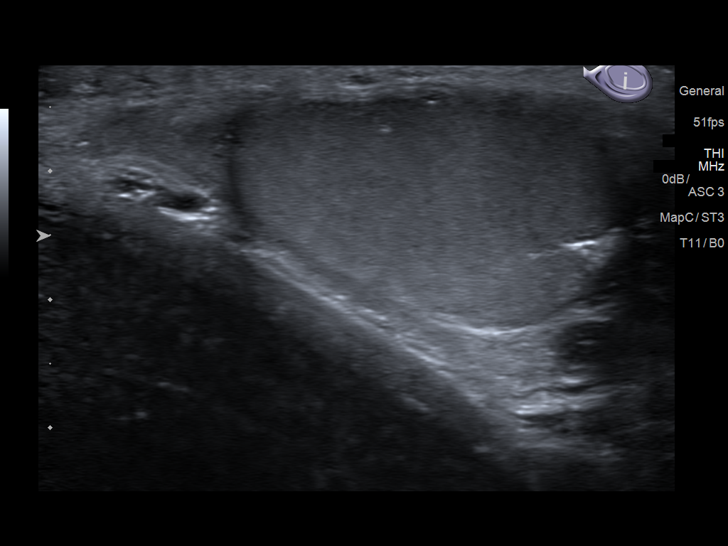
[im 48/83]
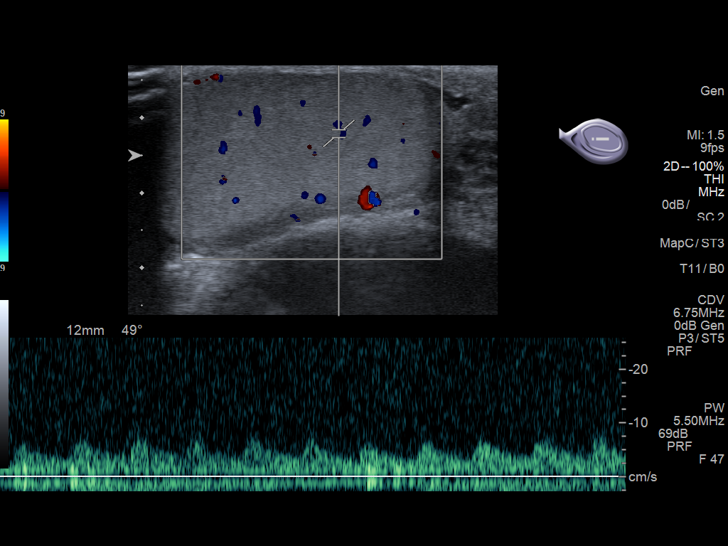
[im 55/83]
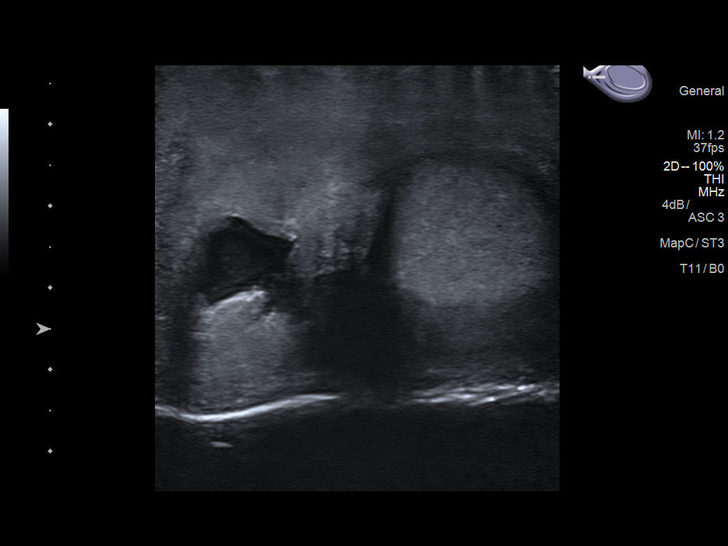
[im 62/83]
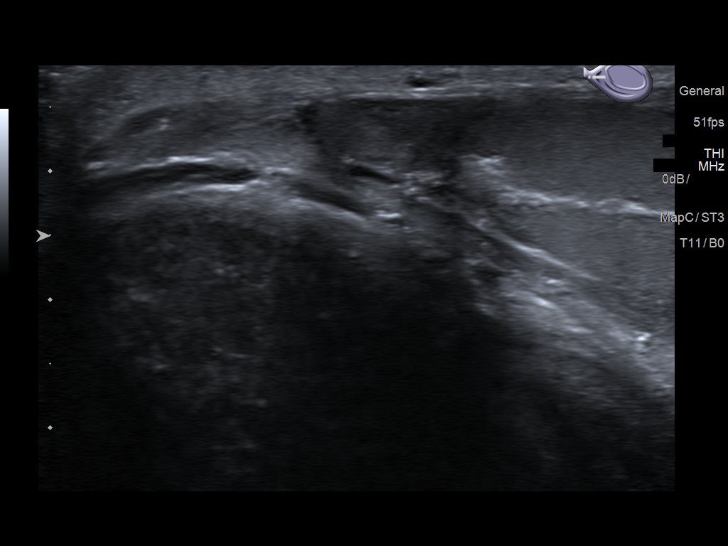
[im 69/83]
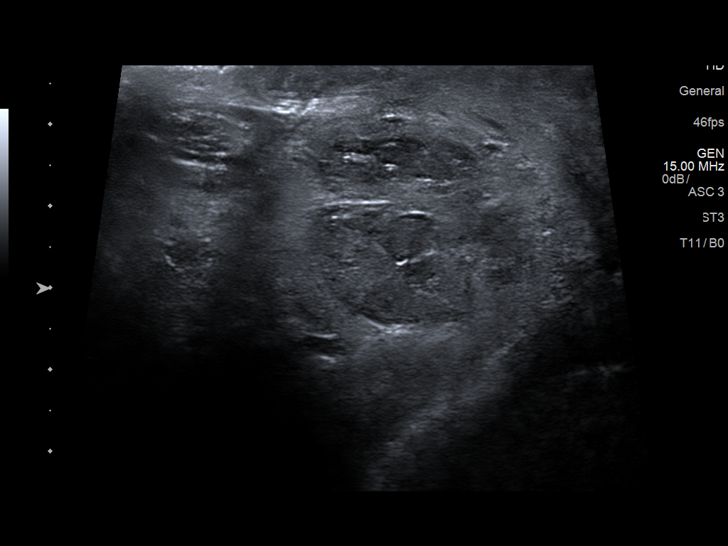
[im 76/83]
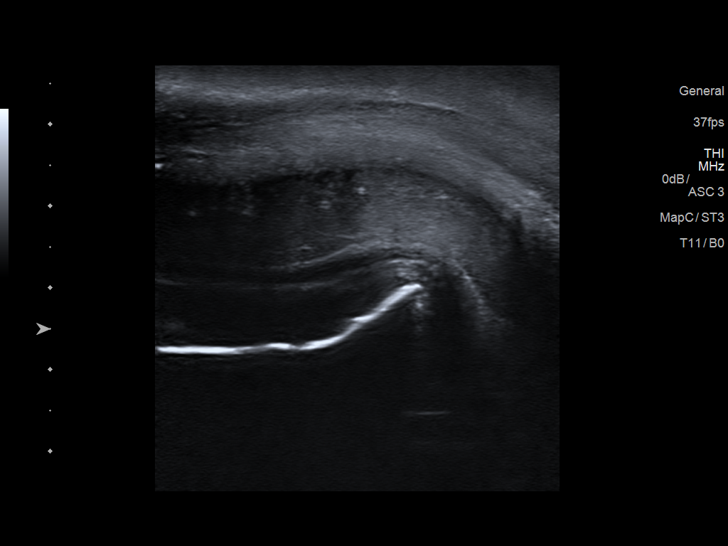
[im 83/83]
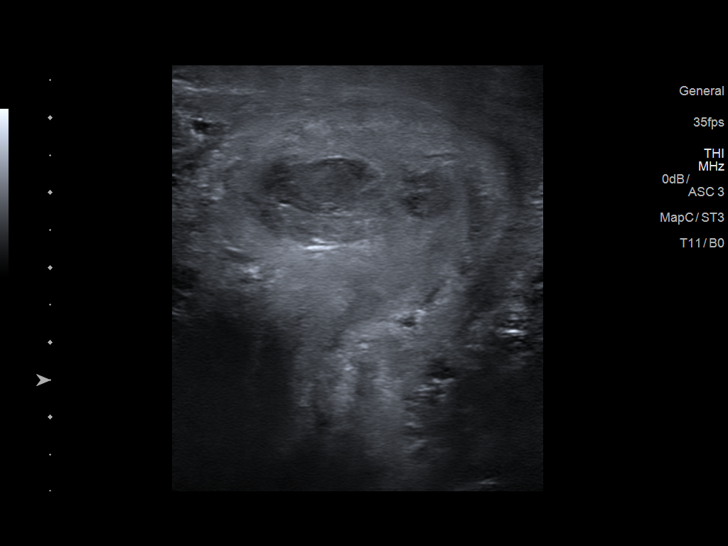

[13 of 25 positions shown; findings below may reference images not displayed]

FINDINGS: Right testicle

Measurements: 4.6 x 3.2 x 3.9 cm. No mass or microlithiasis
visualized.

Left testicle

Measurements: 4.3 x 2 x 3 cm. No mass or microlithiasis visualized.

Right epididymis:  Normal in size and appearance.

Left epididymis:  Normal in size and appearance.

Hydrocele:  A small right hydrocele is noted.

Varicocele:  None visualized.

Evaluation of the penis demonstrates a 4 x 3 x 4.4 cm hematoma
within the proximal penile shaft. It is difficult to determine
tunica integrity on this study. The remainder the penis is
unremarkable.
IMPRESSION: 4 cm hematoma within the proximal penile shaft suspicious for
fracture although a tunica defect is difficult to determine on this
study. No abnormality identified within the mid or distal penile
shaft.

Normal testicles bilaterally.

Critical Value/emergent results were called by telephone at the time
of interpretation on 06/30/2016 at [DATE] to ILYA LORENA , who
verbally acknowledged these results.

## 2020-06-20 ENCOUNTER — Emergency Department (HOSPITAL_COMMUNITY)
Admission: EM | Admit: 2020-06-20 | Discharge: 2020-06-20 | Disposition: A | Discharge status: Home / Self Care | Payer: Self-pay | Attending: Emergency Medicine | Admitting: Emergency Medicine

## 2020-06-20 ENCOUNTER — Other Ambulatory Visit: Payer: Self-pay

## 2020-06-20 DIAGNOSIS — Z20822 Contact with and (suspected) exposure to covid-19: Secondary | ICD-10-CM

## 2020-06-20 DIAGNOSIS — F17210 Nicotine dependence, cigarettes, uncomplicated: Secondary | ICD-10-CM | POA: Insufficient documentation

## 2020-06-20 DIAGNOSIS — I10 Essential (primary) hypertension: Secondary | ICD-10-CM | POA: Insufficient documentation

## 2020-06-20 DIAGNOSIS — R1110 Vomiting, unspecified: Secondary | ICD-10-CM | POA: Insufficient documentation

## 2020-06-20 DIAGNOSIS — U071 COVID-19: Secondary | ICD-10-CM | POA: Insufficient documentation

## 2020-06-20 MED ORDER — ONDANSETRON 4 MG PO TBDP
4.0000 mg | ORAL_TABLET | Freq: Once | ORAL | Status: AC
  Administered 2020-06-20: 4 mg via ORAL
  Filled 2020-06-20: qty 1

## 2020-06-20 MED ORDER — AMLODIPINE BESYLATE 5 MG PO TABS: 5.0000 mg | ORAL_TABLET | Freq: Every day | ORAL | 0 refills | Status: DC

## 2020-06-20 MED ORDER — AMLODIPINE BESYLATE 5 MG PO TABS
5.0000 mg | ORAL_TABLET | Freq: Once | ORAL | Status: AC
  Administered 2020-06-20: 5 mg via ORAL
  Filled 2020-06-20: qty 1

## 2020-06-20 NOTE — Discharge Instructions (Addendum)
Call your primary care doctor or specialist as discussed in the next 2-3 days.   Return immediately back to the ER if:  Your symptoms worsen within the next 12-24 hours. You develop new symptoms such as new fevers, persistent vomiting, new pain, shortness of breath, or new weakness or numbness, or if you have any other concerns.  

## 2020-06-20 NOTE — ED Triage Notes (Addendum)
Pt came in with c/o fever at home, diarrhea, and vomiting since yesterday. Pt states he has vomited three times.

## 2020-06-20 NOTE — ED Provider Notes (Signed)
[facility]-EMERGENCY DEPT Provider Note   CSN: [id number] Arrival date & time: 06/20/20  1913     History Chief Complaint  Patient presents with  . Emesis  . Diarrhea    Victor Briggs is a 35 y.o. male.  Patient complains of fever cough vomiting and diarrhea.  Symptoms been ongoing since yesterday.  Patient states he is concerned he was exposed to COVID at a restaurant but one of the other guests at the time.  He thinks exposure was about 2 days ago.  Otherwise denies headache or chest pain or abdominal pain at this time.  He does admit to history of high blood pressure but has not been taking his medication for some time now because he feels it gives into any side effects.        No past medical history on file.  Patient Active Problem List   Diagnosis Date Noted  . Penile fracture 06/30/2016    Past Surgical History:  Procedure Laterality Date  . CYSTOSCOPY N/A 06/30/2016   Procedure: CYSTOSCOPY FLEXIBLE;  Surgeon: Sebastian Ache, MD;  Location: [facility] OR;  Service: Urology;  Laterality: N/A;  . SCROTAL EXPLORATION N/A 06/30/2016   Procedure: PENILE EXPLORATION WITH REPAIR OF PENILE FX;  Surgeon: Sebastian Ache, MD;  Location: [facility] OR;  Service: Urology;  Laterality: N/A;       No family history on file.  Social History   Tobacco Use  . Smoking status: Current Every Day Smoker    Packs/day: 1.00    Types: Cigarettes  . Smokeless tobacco: Never Used  Substance Use Topics  . Alcohol use: No  . Drug use: Yes    Types: Marijuana    Home Medications Prior to Admission medications   Medication Sig Start Date End Date Taking? Authorizing Provider  cephALEXin (KEFLEX) 500 MG capsule Take 1 capsule (500 mg total) by mouth 4 (four) times daily. Patient not taking: Reported on 09/26/2017 07/04/16   Mackuen, Courteney Lyn, MD  methocarbamol (ROBAXIN) 500 MG tablet Take 1 tablet (500 mg total) by mouth 2 (two) times daily. Patient not taking: Reported on  06/30/2016 11/22/14   [name], PA-C  neomycin-bacitracin-polymyxin (NEOSPORIN) [phone] ointment Apply topically 2 (two) times daily. Until stitches completely dissolved Patient not taking: Reported on 09/26/2017 07/01/16   Sebastian Ache, MD  senna (SENOKOT) 8.6 MG TABS tablet Take 1 tablet (8.6 mg total) by mouth daily. While taking pain meds to prevent constipation. Patient not taking: Reported on 09/26/2017 07/01/16   Sebastian Ache, MD  sulfamethoxazole-trimethoprim (BACTRIM DS,SEPTRA DS) 800-160 MG tablet Take 1 tablet by mouth 2 (two) times daily. X 5 days to prevent post-op infection. Patient not taking: Reported on 09/26/2017 07/01/16   Sebastian Ache, MD    Allergies    Patient has no known allergies.  Review of Systems   Review of Systems  Constitutional: Positive for fever.  HENT: Negative for ear pain and sore throat.   Eyes: Negative for pain.  Respiratory: Positive for cough.   Cardiovascular: Negative for chest pain.  Gastrointestinal: Negative for abdominal pain.  Genitourinary: Negative for flank pain.  Musculoskeletal: Negative for back pain.  Skin: Negative for color change and rash.  Neurological: Negative for syncope.  All other systems reviewed and are negative.   Physical Exam Updated Vital Signs BP (!) 180/119   Pulse 86   Temp 98.1 F (36.7 C) (Oral)   Resp 18   Ht 6\' 3"  (1.905 m)   Wt 90.7 kg  SpO2 100%   BMI 25.00 kg/m   Physical Exam Constitutional:      General: He is not in acute distress.    Appearance: He is well-developed.  HENT:     Head: Normocephalic.     Nose: Nose normal.  Eyes:     Extraocular Movements: Extraocular movements intact.  Cardiovascular:     Rate and Rhythm: Normal rate.  Pulmonary:     Effort: Pulmonary effort is normal.  Skin:    Coloration: Skin is not jaundiced.  Neurological:     Mental Status: He is alert. Mental status is at baseline.     ED Results / Procedures / Treatments   Labs (all labs  ordered are listed, but only abnormal results are displayed) Labs Reviewed  SARS CORONAVIRUS 2 (TAT 6-24 HRS)    EKG None  Radiology No results found.  Procedures Procedures   Medications Ordered in ED Medications  amLODipine (NORVASC) tablet 5 mg (5 mg Oral Given 06/20/20 2037)  ondansetron (ZOFRAN-ODT) disintegrating tablet 4 mg (4 mg Oral Given 06/20/20 2037)    ED Course  I have reviewed the triage vital signs and the nursing notes.  Pertinent labs & imaging results that were available during my care of the patient were reviewed by me and considered in my medical decision making (see chart for details).    MDM Rules/Calculators/A&P                          COVID test is sent and pending final result.  Patient blood pressure significantly elevated.  Given Zofran and tolerating Norvasc with some improvement of blood pressure.  Given prescription of Norvasc to go home with.  Advising follow-up with his doctor within the week.  Advised immediate return for difficulty breathing or any additional concerns.  Final Clinical Impression(s) / ED Diagnoses Final diagnoses:  Suspected COVID-19 virus infection  Hypertension, unspecified type    Rx / DC Orders ED Discharge Orders    None       Cheryll Cockayne, MD 06/20/20 2051

## 2020-06-21 LAB — SARS CORONAVIRUS 2 (TAT 6-24 HRS): SARS Coronavirus 2: POSITIVE — AB

## 2020-06-21 NOTE — ED Notes (Signed)
Pt wanted clarification on his covid test.

## 2022-03-26 ENCOUNTER — Ambulatory Visit
Admission: EM | Admit: 2022-03-26 | Discharge: 2022-03-26 | Disposition: A | Discharge status: Home / Self Care | Payer: Self-pay | Attending: Nurse Practitioner | Admitting: Nurse Practitioner

## 2022-03-26 ENCOUNTER — Ambulatory Visit: Payer: Self-pay

## 2022-03-26 DIAGNOSIS — Z113 Encounter for screening for infections with a predominantly sexual mode of transmission: Secondary | ICD-10-CM | POA: Insufficient documentation

## 2022-03-26 DIAGNOSIS — Z202 Contact with and (suspected) exposure to infections with a predominantly sexual mode of transmission: Secondary | ICD-10-CM | POA: Insufficient documentation

## 2022-03-26 DIAGNOSIS — I10 Essential (primary) hypertension: Secondary | ICD-10-CM | POA: Insufficient documentation

## 2022-03-26 MED ORDER — DOXYCYCLINE HYCLATE 100 MG PO CAPS: 100.0000 mg | ORAL_CAPSULE | Freq: Two times a day (BID) | ORAL | 0 refills | Status: AC

## 2022-03-26 NOTE — ED Provider Notes (Addendum)
[facility]    CSN: [id number] Arrival date & time: 03/26/22  1149      History   Chief Complaint Chief Complaint  Patient presents with   Exposure to STD   Appointment    HPI Victor Briggs is a 37 y.o. male presents for evaluation of STD exposure.  Patient reports he was informed he was exposed to chlamydia.  He denies any current symptoms including dysuria, penile discharge, or testicular pain or swelling.  He would also like to be screened for other STDs.  His BP was noted to be elevated on intake at 206/131.  He does report a history of hypertension and states he was treated in jail with some type of medication that caused him to cough.  He states he has not been treated or evaluated for his blood pressure in the past 3 years.  He states he was in the ER a year ago and his blood pressure was elevated but it was not addressed.  He reports intermittent headaches but at the time of evaluation denies headache, chest pain, shortness of breath, dizziness.  He does not have a PCP.   Exposure to STD    History reviewed. No pertinent past medical history.  Patient Active Problem List   Diagnosis Date Noted   Penile fracture 06/30/2016    Past Surgical History:  Procedure Laterality Date   CYSTOSCOPY N/A 06/30/2016   Procedure: CYSTOSCOPY FLEXIBLE;  Surgeon: Alexis Frock, MD;  Location: [place];  Service: Urology;  Laterality: N/A;   SCROTAL EXPLORATION N/A 06/30/2016   Procedure: PENILE EXPLORATION WITH REPAIR OF PENILE FX;  Surgeon: Alexis Frock, MD;  Location: [place];  Service: Urology;  Laterality: N/A;       Home Medications    Prior to Admission medications   Medication Sig Start Date End Date Taking? Authorizing Provider  doxycycline (VIBRAMYCIN) 100 MG capsule Take 1 capsule (100 mg total) by mouth 2 (two) times daily for 7 days. 03/26/22 04/02/22 Yes Melynda Ripple, NP  amLODipine (NORVASC) 5 MG tablet Take 1 tablet (5 mg total) by mouth daily. 06/20/20 07/20/20   Luna Fuse, MD    Family History History reviewed. No pertinent family history.  Social History Social History   Tobacco Use   Smoking status: Every Day    Packs/day: 1.00    Types: Cigarettes   Smokeless tobacco: Never  Substance Use Topics   Alcohol use: No   Drug use: Yes    Types: Marijuana     Allergies   Patient has no known allergies.   Review of Systems Review of Systems  Genitourinary:        STD testing/exposure to chlamydia     Physical Exam Triage Vital Signs ED Triage Vitals  Enc Vitals Group     BP 03/26/22 1204 (!) 194/135     Pulse Rate 03/26/22 1203 79     Resp 03/26/22 1203 20     Temp 03/26/22 1203 98.2 F (36.8 C)     Temp Source 03/26/22 1203 Oral     SpO2 03/26/22 1203 98 %     Weight --      Height --      Head Circumference --      Peak Flow --      Pain Score 03/26/22 1202 0     Pain Loc --      Pain Edu? --      Excl. in [place]? --  No data found.  Updated Vital Signs BP (!) 206/131 (BP Location: Right Arm)   Pulse 79   Temp 98.2 F (36.8 C) (Oral)   Resp 20   SpO2 98%   Visual Acuity Right Eye Distance:   Left Eye Distance:   Bilateral Distance:    Right Eye Near:   Left Eye Near:    Bilateral Near:     Physical Exam Vitals and nursing note reviewed.  Constitutional:      General: He is not in acute distress.    Appearance: Normal appearance. He is not ill-appearing.  HENT:     Head: Normocephalic and atraumatic.  Eyes:     Pupils: Pupils are equal, round, and reactive to light.  Cardiovascular:     Rate and Rhythm: Normal rate and regular rhythm.     Heart sounds: Normal heart sounds.  Pulmonary:     Effort: Pulmonary effort is normal.     Breath sounds: Normal breath sounds.  Skin:    General: Skin is warm and dry.  Neurological:     General: No focal deficit present.     Mental Status: He is alert and oriented to person, place, and time.  Psychiatric:        Mood and Affect: Mood normal.         Behavior: Behavior normal.      UC Treatments / Results  Labs (all labs ordered are listed, but only abnormal results are displayed) Labs Reviewed  RPR  HIV ANTIBODY (ROUTINE TESTING W REFLEX)  CYTOLOGY, (ORAL, ANAL, URETHRAL) ANCILLARY ONLY    EKG Shows sinus rhythm, heart rate 76 with nonspecific T wave abnormalities with inverted T waves in V6 and evidence of LVH  Radiology No results found.  Procedures Procedures (including critical care time)  Medications Ordered in UC Medications - No data to display  Initial Impression / Assessment and Plan / UC Course  I have reviewed the triage vital signs and the nursing notes.  Pertinent labs & imaging results that were available during my care of the patient were reviewed by me and considered in my medical decision making (see chart for details).     I reviewed exam and symptoms with patient. Start doxycycline twice daily for 7 days for known chlamydia exposure.  STD testing is ordered and will contact for positive results Reviewed patient blood pressure and EKG in length.  Discussed uncontrolled hypertension with EKG changes and need for further evaluation and management in the emergency room.  Patient states he is in agreement with this plan will go POV to to the emergency room.  He currently denies any headache, chest pain, shortness of breath, dizziness.  He was instructed to pull over and call 911 if he develops any of the symptoms in transit and he verbalized understanding. Final Clinical Impressions(s) / UC Diagnoses   Final diagnoses:  Screening examination for STD (sexually transmitted disease)  Exposure to chlamydia  Hypertension, unspecified type  Uncontrolled hypertension     Discharge Instructions      Start doxycycline twice daily for 7 days for treatment of exposure to chlamydia The clinic will contact you with results of any of your other testing is positive Your blood pressure is elevated and you are  having changes on your EKG.  Please go to the emergency room for further evaluation and management of your blood pressure     ED Prescriptions     Medication Sig Dispense Auth. Provider   doxycycline (  VIBRAMYCIN) 100 MG capsule Take 1 capsule (100 mg total) by mouth 2 (two) times daily for 7 days. 14 capsule Melynda Ripple, NP      PDMP not reviewed this encounter.   Melynda Ripple, NP 03/26/22 1246    Melynda Ripple, NP 03/26/22 1247

## 2022-03-26 NOTE — ED Triage Notes (Signed)
Pt reports partner testing positive with Chlamydia Friday.  Pt reports no sxs.

## 2022-03-26 NOTE — ED Notes (Signed)
Medical Provider notified about elevated BP. Rechecked 5 minutes after. Pt BP is higher and presents in no distress. States BP is always high.

## 2022-03-26 NOTE — Discharge Instructions (Signed)
Start doxycycline twice daily for 7 days for treatment of exposure to chlamydia The clinic will contact you with results of any of your other testing is positive Your blood pressure is elevated and you are having changes on your EKG.  Please go to the emergency room for further evaluation and management of your blood pressure

## 2022-03-27 LAB — CYTOLOGY, (ORAL, ANAL, URETHRAL) ANCILLARY ONLY
Chlamydia: POSITIVE — AB
Comment: NEGATIVE
Comment: NEGATIVE
Comment: NORMAL
Neisseria Gonorrhea: NEGATIVE
Trichomonas: POSITIVE — AB

## 2022-03-28 ENCOUNTER — Telehealth (HOSPITAL_COMMUNITY): Payer: Self-pay | Admitting: Emergency Medicine

## 2022-03-28 MED ORDER — METRONIDAZOLE 500 MG PO TABS: 2000.0000 mg | ORAL_TABLET | Freq: Once | ORAL | 0 refills | Status: AC

## 2022-03-30 LAB — HIV 1/2 AB DIFFERENTIATION
HIV 1 Ab: NONREACTIVE
HIV 2 Ab: NONREACTIVE
NOTE (HIV CONF MULTIP: NEGATIVE

## 2022-03-30 LAB — HIV ANTIBODY (ROUTINE TESTING W REFLEX): HIV Screen 4th Generation wRfx: REACTIVE

## 2022-03-30 LAB — RPR: RPR Ser Ql: NONREACTIVE

## 2022-03-30 LAB — HIV-1/HIV-2 QUALITATIVE RNA

## 2022-10-18 ENCOUNTER — Ambulatory Visit
Admission: EM | Admit: 2022-10-18 | Discharge: 2022-10-18 | Disposition: A | Discharge status: Home / Self Care | Payer: Self-pay

## 2022-10-18 ENCOUNTER — Emergency Department (HOSPITAL_COMMUNITY): Payer: Self-pay

## 2022-10-18 ENCOUNTER — Other Ambulatory Visit: Payer: Self-pay

## 2022-10-18 ENCOUNTER — Emergency Department (HOSPITAL_COMMUNITY)
Admission: EM | Admit: 2022-10-18 | Discharge: 2022-10-18 | Disposition: A | Discharge status: Home / Self Care | Payer: Self-pay | Attending: Emergency Medicine | Admitting: Emergency Medicine

## 2022-10-18 DIAGNOSIS — S61451A Open bite of right hand, initial encounter: Secondary | ICD-10-CM | POA: Insufficient documentation

## 2022-10-18 DIAGNOSIS — S6991XA Unspecified injury of right wrist, hand and finger(s), initial encounter: Secondary | ICD-10-CM

## 2022-10-18 DIAGNOSIS — W503XXA Accidental bite by another person, initial encounter: Secondary | ICD-10-CM | POA: Insufficient documentation

## 2022-10-18 DIAGNOSIS — L03113 Cellulitis of right upper limb: Secondary | ICD-10-CM | POA: Insufficient documentation

## 2022-10-18 DIAGNOSIS — I10 Essential (primary) hypertension: Secondary | ICD-10-CM | POA: Insufficient documentation

## 2022-10-18 HISTORY — DX: Essential (primary) hypertension: I10

## 2022-10-18 LAB — CBC WITH DIFFERENTIAL/PLATELET
Abs Immature Granulocytes: 0.02 10*3/uL (ref 0.00–0.07)
Basophils Absolute: 0 10*3/uL (ref 0.0–0.1)
Basophils Relative: 0 %
Eosinophils Absolute: 0.1 10*3/uL (ref 0.0–0.5)
Eosinophils Relative: 1 %
HCT: 41.9 % (ref 39.0–52.0)
Hemoglobin: 13.3 g/dL (ref 13.0–17.0)
Immature Granulocytes: 0 %
Lymphocytes Relative: 17 %
Lymphs Abs: 1.4 10*3/uL (ref 0.7–4.0)
MCH: 27.4 pg (ref 26.0–34.0)
MCHC: 31.7 g/dL (ref 30.0–36.0)
MCV: 86.4 fL (ref 80.0–100.0)
Monocytes Absolute: 0.9 10*3/uL (ref 0.1–1.0)
Monocytes Relative: 11 %
Neutro Abs: 5.8 10*3/uL (ref 1.7–7.7)
Neutrophils Relative %: 71 %
Platelets: 231 10*3/uL (ref 150–400)
RBC: 4.85 MIL/uL (ref 4.22–5.81)
RDW: 14.9 % (ref 11.5–15.5)
WBC: 8.2 10*3/uL (ref 4.0–10.5)
nRBC: 0 % (ref 0.0–0.2)

## 2022-10-18 LAB — COMPREHENSIVE METABOLIC PANEL
ALT: 16 U/L (ref 0–44)
AST: 21 U/L (ref 15–41)
Albumin: 3.9 g/dL (ref 3.5–5.0)
Alkaline Phosphatase: 59 U/L (ref 38–126)
Anion gap: 8 (ref 5–15)
BUN: 9 mg/dL (ref 6–20)
CO2: 26 mmol/L (ref 22–32)
Calcium: 8.7 mg/dL — ABNORMAL LOW (ref 8.9–10.3)
Chloride: 101 mmol/L (ref 98–111)
Creatinine, Ser: 0.9 mg/dL (ref 0.61–1.24)
GFR, Estimated: >60 mL/min (ref >60)
Glucose, Bld: 116 mg/dL — ABNORMAL HIGH (ref 70–99)
Potassium: 3.5 mmol/L (ref 3.5–5.1)
Sodium: 135 mmol/L (ref 135–145)
Total Bilirubin: 0.8 mg/dL (ref 0.3–1.2)
Total Protein: 7.5 g/dL (ref 6.5–8.1)

## 2022-10-18 LAB — I-STAT CG4 LACTIC ACID, ED: Lactic Acid, Venous: 1.7 mmol/L (ref 0.5–1.9)

## 2022-10-18 MED ORDER — OXYCODONE-ACETAMINOPHEN 5-325 MG PO TABS: 1.0000 | ORAL_TABLET | Freq: Four times a day (QID) | ORAL | 0 refills | Status: DC | PRN

## 2022-10-18 MED ORDER — IOHEXOL 300 MG/ML  SOLN
80.0000 mL | Freq: Once | INTRAMUSCULAR | Status: AC | PRN
  Administered 2022-10-18: 80 mL via INTRAVENOUS

## 2022-10-18 MED ORDER — MORPHINE SULFATE (PF) 4 MG/ML IV SOLN
4.0000 mg | Freq: Once | INTRAVENOUS | Status: AC
  Administered 2022-10-18: 4 mg via INTRAVENOUS
  Filled 2022-10-18: qty 1

## 2022-10-18 MED ORDER — AMLODIPINE BESYLATE 5 MG PO TABS: 5.0000 mg | ORAL_TABLET | Freq: Every day | ORAL | 0 refills | Status: DC

## 2022-10-18 MED ORDER — AMOXICILLIN-POT CLAVULANATE 875-125 MG PO TABS: 1.0000 | ORAL_TABLET | Freq: Two times a day (BID) | ORAL | 0 refills | Status: DC

## 2022-10-18 MED ORDER — AMPICILLIN-SULBACTAM SODIUM 3 (2-1) G IJ SOLR
3.0000 g | Freq: Once | INTRAMUSCULAR | Status: AC
  Administered 2022-10-18: 3 g via INTRAVENOUS
  Filled 2022-10-18: qty 8

## 2022-10-18 MED ORDER — OXYCODONE-ACETAMINOPHEN 5-325 MG PO TABS
1.0000 | ORAL_TABLET | Freq: Once | ORAL | Status: AC
  Administered 2022-10-18: 1 via ORAL
  Filled 2022-10-18: qty 1
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
## 2022-10-18 NOTE — ED Notes (Signed)
[name] PA-C notified of elevated blood pressure. Verbal confirmation received from [name] to proceed with discharge. Pt was prescribed blood pressure medication on discharge. Pt verbalized to this RN that his dad is picking him up on discharge and that he is not going to drive home.

## 2022-10-18 NOTE — Discharge Instructions (Addendum)
You have been evaluated for heel injury.  Fortunately no evidence of any broken bone or abscess in your right hand.  Please take antibiotic as prescribed.  It is important for you to follow-up closely with hand specialist either this Friday upcoming Monday in his office for further care.  Be aware opiate pain medication can cause drowsiness so do not operate any heavy machinery or driving while taking pain medication.  Your blood pressure is high today, please resume taking your amlodipine and have your blood pressure rechecked by your doctor at your earliest convenience.
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
## 2022-10-18 NOTE — ED Notes (Signed)
Patient is being discharged from the Urgent Care and sent to the Emergency Department via [facility] . Per Corinna Capra., patient is in need of higher level of care due to complexity of care. Patient is aware and verbalizes understanding of plan of care.  Vitals:   10/18/22 1450  BP: (!) 176/111  Pulse: 93  Resp: 18  Temp: 98.9 F (37.2 C)  SpO2: 98%
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
## 2023-05-10 ENCOUNTER — Other Ambulatory Visit: Payer: Self-pay

## 2023-05-10 ENCOUNTER — Emergency Department (HOSPITAL_COMMUNITY)
Admission: EM | Admit: 2023-05-10 | Discharge: 2023-05-11 | Disposition: A | Discharge status: Home / Self Care | Payer: Self-pay | Attending: Emergency Medicine | Admitting: Emergency Medicine

## 2023-05-10 ENCOUNTER — Encounter (HOSPITAL_COMMUNITY): Payer: Self-pay

## 2023-05-10 ENCOUNTER — Emergency Department (HOSPITAL_COMMUNITY): Payer: Self-pay

## 2023-05-10 DIAGNOSIS — S51012A Laceration without foreign body of left elbow, initial encounter: Secondary | ICD-10-CM | POA: Insufficient documentation

## 2023-05-10 DIAGNOSIS — S61412A Laceration without foreign body of left hand, initial encounter: Secondary | ICD-10-CM | POA: Insufficient documentation

## 2023-05-10 DIAGNOSIS — S41112A Laceration without foreign body of left upper arm, initial encounter: Secondary | ICD-10-CM

## 2023-05-10 DIAGNOSIS — I10 Essential (primary) hypertension: Secondary | ICD-10-CM | POA: Insufficient documentation

## 2023-05-10 DIAGNOSIS — S61210A Laceration without foreign body of right index finger without damage to nail, initial encounter: Secondary | ICD-10-CM | POA: Insufficient documentation

## 2023-05-10 DIAGNOSIS — S51812A Laceration without foreign body of left forearm, initial encounter: Secondary | ICD-10-CM | POA: Insufficient documentation

## 2023-05-10 LAB — BASIC METABOLIC PANEL WITH GFR
Anion gap: 8 (ref 5–15)
BUN: 9 mg/dL (ref 6–20)
CO2: 23 mmol/L (ref 22–32)
Calcium: 9.3 mg/dL (ref 8.9–10.3)
Chloride: 106 mmol/L (ref 98–111)
Creatinine, Ser: 1.16 mg/dL (ref 0.61–1.24)
GFR, Estimated: >60 mL/min (ref >60)
Glucose, Bld: 161 mg/dL — ABNORMAL HIGH (ref 70–99)
Potassium: 3.8 mmol/L (ref 3.5–5.1)
Sodium: 137 mmol/L (ref 135–145)

## 2023-05-10 LAB — CBC WITH DIFFERENTIAL/PLATELET
Abs Immature Granulocytes: 0.03 10*3/uL (ref 0.00–0.07)
Basophils Absolute: 0 10*3/uL (ref 0.0–0.1)
Basophils Relative: 0 %
Eosinophils Absolute: 0 10*3/uL (ref 0.0–0.5)
Eosinophils Relative: 0 %
HCT: 41.6 % (ref 39.0–52.0)
Hemoglobin: 13.6 g/dL (ref 13.0–17.0)
Immature Granulocytes: 0 %
Lymphocytes Relative: 26 %
Lymphs Abs: 2.1 10*3/uL (ref 0.7–4.0)
MCH: 26.4 pg (ref 26.0–34.0)
MCHC: 32.7 g/dL (ref 30.0–36.0)
MCV: 80.6 fL (ref 80.0–100.0)
Monocytes Absolute: 0.9 10*3/uL (ref 0.1–1.0)
Monocytes Relative: 11 %
Neutro Abs: 5 10*3/uL (ref 1.7–7.7)
Neutrophils Relative %: 63 %
Platelets: 252 10*3/uL (ref 150–400)
RBC: 5.16 MIL/uL (ref 4.22–5.81)
RDW: 15 % (ref 11.5–15.5)
WBC: 8.1 10*3/uL (ref 4.0–10.5)
nRBC: 0 % (ref 0.0–0.2)

## 2023-05-10 LAB — TYPE AND SCREEN
ABO/RH(D): O POS
Antibody Screen: NEGATIVE

## 2023-05-10 MED ORDER — CHLORHEXIDINE GLUCONATE 0.12 % MT SOLN
OROMUCOSAL | Status: AC
  Filled 2023-05-10: qty 15

## 2023-05-10 MED ORDER — HYDROMORPHONE HCL 1 MG/ML IJ SOLN
1.0000 mg | Freq: Once | INTRAMUSCULAR | Status: AC
  Administered 2023-05-10: 1 mg via INTRAVENOUS

## 2023-05-10 MED ORDER — FENTANYL CITRATE PF 50 MCG/ML IJ SOSY
PREFILLED_SYRINGE | INTRAMUSCULAR | Status: AC
  Filled 2023-05-10: qty 1

## 2023-05-10 MED ORDER — PROPOFOL 10 MG/ML IV BOLUS
INTRAVENOUS | Status: AC
  Filled 2023-05-10: qty 20

## 2023-05-10 MED ORDER — LIDOCAINE-EPINEPHRINE (PF) 2 %-1:200000 IJ SOLN
10.0000 mL | Freq: Once | INTRAMUSCULAR | Status: AC
  Administered 2023-05-10: 10 mL
  Filled 2023-05-10: qty 20

## 2023-05-10 MED ORDER — FENTANYL CITRATE (PF) 250 MCG/5ML IJ SOLN
INTRAMUSCULAR | Status: AC
  Filled 2023-05-10: qty 5

## 2023-05-10 MED ORDER — MIDAZOLAM HCL 2 MG/2ML IJ SOLN
INTRAMUSCULAR | Status: AC
  Filled 2023-05-10: qty 2

## 2023-05-10 MED ORDER — FENTANYL CITRATE PF 50 MCG/ML IJ SOSY
50.0000 ug | PREFILLED_SYRINGE | INTRAMUSCULAR | Status: DC | PRN
  Administered 2023-05-10 – 2023-05-11 (×4): 50 ug via INTRAVENOUS
  Filled 2023-05-10 (×4): qty 1

## 2023-05-10 MED ORDER — HYDROMORPHONE HCL 1 MG/ML IJ SOLN
INTRAMUSCULAR | Status: AC
  Filled 2023-05-10: qty 1

## 2023-05-10 NOTE — ED Notes (Signed)
 EDP Long instructed tourniquet applied to Lt arm to stop bleeding in Lt hand. BP cuff applied & manual pressure added since it was closer in reach & EDP instructed to keep that on instead of switching to a tourniquet. This was applied at 1641, Lt hand sutures done after it was applied.

## 2023-05-10 NOTE — ED Notes (Signed)
 Pt reports decreased sensation to Lt 4th & 5th finger when EDP assessing pt.

## 2023-05-10 NOTE — Progress Notes (Signed)
   05/10/23 2043  Spiritual Encounters  Type of Visit Initial  Care provided to: Patient  Referral source Trauma page  Reason for visit Trauma  OnCall Visit Yes   Chaplain responding to Trauma 2 page.  Pt unavailable to speak as medical team providing care.  No support persons present.  Chaplain services remain available by Spiritual Consult or for emergent cases, paging [phone]  Chaplain Raelene Bott, MDiv [email or web address] [phone]

## 2023-05-10 NOTE — ED Notes (Signed)
 Tourniquet removed at 1716 & pressure drg applied to Lt hand by EDP Long.
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
## 2023-05-10 NOTE — ED Notes (Signed)
 Lido & Suture cart at bedside.
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
## 2023-05-10 NOTE — ED Notes (Signed)
 X-ray at bedside
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
## 2023-05-10 NOTE — ED Notes (Signed)
 Patient becoming agitated because we are unable to give him water because he is going to OR. Explained to patient why we are unable to give him water. Complaining of pain to left wrist - PRN pain medication given.
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
## 2023-05-11 ENCOUNTER — Encounter (HOSPITAL_COMMUNITY)
Admission: EM | Disposition: A | Discharge status: Home / Self Care | Payer: Self-pay | Source: Home / Self Care | Attending: Emergency Medicine

## 2023-05-11 ENCOUNTER — Encounter (HOSPITAL_COMMUNITY): Payer: Self-pay | Admitting: Certified Registered Nurse Anesthetist

## 2023-05-11 ENCOUNTER — Emergency Department (EMERGENCY_DEPARTMENT_HOSPITAL): Payer: Self-pay | Admitting: Anesthesiology

## 2023-05-11 ENCOUNTER — Emergency Department (HOSPITAL_COMMUNITY): Payer: Self-pay | Admitting: Anesthesiology

## 2023-05-11 ENCOUNTER — Other Ambulatory Visit: Payer: Self-pay

## 2023-05-11 ENCOUNTER — Other Ambulatory Visit (HOSPITAL_COMMUNITY): Payer: Self-pay

## 2023-05-11 DIAGNOSIS — S61217A Laceration without foreign body of left little finger without damage to nail, initial encounter: Secondary | ICD-10-CM

## 2023-05-11 DIAGNOSIS — S61215A Laceration without foreign body of left ring finger without damage to nail, initial encounter: Secondary | ICD-10-CM

## 2023-05-11 DIAGNOSIS — S51812A Laceration without foreign body of left forearm, initial encounter: Secondary | ICD-10-CM

## 2023-05-11 HISTORY — PX: NERVE, TENDON AND ARTERY REPAIR: SHX5695

## 2023-05-11 SURGERY — NERVE, TENDON AND ARTERY REPAIR
Anesthesia: General | Laterality: Left

## 2023-05-11 MED ORDER — PHENYLEPHRINE 80 MCG/ML (10ML) SYRINGE FOR IV PUSH (FOR BLOOD PRESSURE SUPPORT)
PREFILLED_SYRINGE | INTRAVENOUS | Status: AC
  Filled 2023-05-11: qty 10

## 2023-05-11 MED ORDER — BUPIVACAINE HCL (PF) 0.25 % IJ SOLN
INTRAMUSCULAR | Status: DC | PRN
  Administered 2023-05-11: 10 mL

## 2023-05-11 MED ORDER — ROCURONIUM BROMIDE 10 MG/ML (PF) SYRINGE
PREFILLED_SYRINGE | INTRAVENOUS | Status: DC | PRN
  Administered 2023-05-11: 50 mg via INTRAVENOUS

## 2023-05-11 MED ORDER — ACETAMINOPHEN 10 MG/ML IV SOLN: 1000.0000 mg | Freq: Once | INTRAVENOUS | Status: DC | PRN

## 2023-05-11 MED ORDER — HYDRALAZINE HCL 20 MG/ML IJ SOLN
INTRAMUSCULAR | Status: DC | PRN
  Administered 2023-05-11 (×2): 10 mg via INTRAVENOUS

## 2023-05-11 MED ORDER — EPHEDRINE 5 MG/ML INJ
INTRAVENOUS | Status: AC
  Filled 2023-05-11: qty 5

## 2023-05-11 MED ORDER — KETAMINE HCL 50 MG/5ML IJ SOSY
PREFILLED_SYRINGE | INTRAMUSCULAR | Status: AC
  Filled 2023-05-11: qty 5

## 2023-05-11 MED ORDER — HYDROCODONE-ACETAMINOPHEN 5-325 MG PO TABS
1.0000 | ORAL_TABLET | Freq: Four times a day (QID) | ORAL | 0 refills | Status: DC | PRN
  Filled 2023-05-11: qty 20, 5d supply, fill #0

## 2023-05-11 MED ORDER — HYDRALAZINE HCL 20 MG/ML IJ SOLN
INTRAMUSCULAR | Status: AC
  Filled 2023-05-11: qty 1

## 2023-05-11 MED ORDER — OXYCODONE HCL 5 MG PO TABS: 5.0000 mg | ORAL_TABLET | Freq: Once | ORAL | Status: DC | PRN

## 2023-05-11 MED ORDER — KETAMINE HCL 10 MG/ML IJ SOLN
INTRAMUSCULAR | Status: DC | PRN
  Administered 2023-05-11: 20 mg via INTRAVENOUS
  Administered 2023-05-11: 30 mg via INTRAVENOUS

## 2023-05-11 MED ORDER — SULFAMETHOXAZOLE-TRIMETHOPRIM 800-160 MG PO TABS
1.0000 | ORAL_TABLET | Freq: Two times a day (BID) | ORAL | 0 refills | Status: DC
  Filled 2023-05-11: qty 14, 7d supply, fill #0

## 2023-05-11 MED ORDER — ACETAMINOPHEN 500 MG PO TABS: 1000.0000 mg | ORAL_TABLET | Freq: Once | ORAL | Status: DC | PRN

## 2023-05-11 MED ORDER — ACETAMINOPHEN 160 MG/5ML PO SOLN: 1000.0000 mg | Freq: Once | ORAL | Status: DC | PRN

## 2023-05-11 MED ORDER — FENTANYL CITRATE (PF) 250 MCG/5ML IJ SOLN
INTRAMUSCULAR | Status: DC | PRN
  Administered 2023-05-11 (×5): 50 ug via INTRAVENOUS

## 2023-05-11 MED ORDER — LIDOCAINE 2% (20 MG/ML) 5 ML SYRINGE
INTRAMUSCULAR | Status: DC | PRN
  Administered 2023-05-11: 60 mg via INTRAVENOUS

## 2023-05-11 MED ORDER — OXYCODONE HCL 5 MG/5ML PO SOLN: 5.0000 mg | Freq: Once | ORAL | Status: DC | PRN

## 2023-05-11 MED ORDER — FENTANYL CITRATE (PF) 250 MCG/5ML IJ SOLN
INTRAMUSCULAR | Status: AC
  Filled 2023-05-11: qty 5

## 2023-05-11 MED ORDER — SODIUM CHLORIDE 0.9 % IV SOLN: Freq: Once | INTRAVENOUS | Status: AC

## 2023-05-11 MED ORDER — CEFAZOLIN SODIUM-DEXTROSE 2-3 GM-%(50ML) IV SOLR
INTRAVENOUS | Status: DC | PRN
  Administered 2023-05-11: 2 g via INTRAVENOUS

## 2023-05-11 MED ORDER — ONDANSETRON HCL 4 MG/2ML IJ SOLN
INTRAMUSCULAR | Status: AC
  Filled 2023-05-11: qty 2

## 2023-05-11 MED ORDER — PROPOFOL 10 MG/ML IV BOLUS
INTRAVENOUS | Status: AC
  Filled 2023-05-11: qty 20

## 2023-05-11 MED ORDER — MIDAZOLAM HCL 2 MG/2ML IJ SOLN
INTRAMUSCULAR | Status: AC
Start: 2023-05-11 — End: ?
  Filled 2023-05-11: qty 2

## 2023-05-11 MED ORDER — AMISULPRIDE (ANTIEMETIC) 5 MG/2ML IV SOLN
INTRAVENOUS | Status: AC
  Filled 2023-05-11: qty 2

## 2023-05-11 MED ORDER — FENTANYL CITRATE (PF) 100 MCG/2ML IJ SOLN: 25.0000 ug | INTRAMUSCULAR | Status: DC | PRN

## 2023-05-11 MED ORDER — CEFAZOLIN SODIUM 1 G IJ SOLR
INTRAMUSCULAR | Status: AC
  Filled 2023-05-11: qty 20

## 2023-05-11 MED ORDER — ROCURONIUM BROMIDE 10 MG/ML (PF) SYRINGE
PREFILLED_SYRINGE | INTRAVENOUS | Status: AC
  Filled 2023-05-11: qty 10

## 2023-05-11 MED ORDER — LACTATED RINGERS IV SOLN: INTRAVENOUS | Status: DC | PRN

## 2023-05-11 MED ORDER — DEXMEDETOMIDINE HCL IN NACL 80 MCG/20ML IV SOLN
INTRAVENOUS | Status: DC | PRN
  Administered 2023-05-11 (×3): 4 ug via INTRAVENOUS
  Administered 2023-05-11: 8 ug via INTRAVENOUS

## 2023-05-11 MED ORDER — SUGAMMADEX SODIUM 200 MG/2ML IV SOLN
INTRAVENOUS | Status: DC | PRN
  Administered 2023-05-11 (×4): 50 mg via INTRAVENOUS

## 2023-05-11 MED ORDER — MIDAZOLAM HCL 5 MG/5ML IJ SOLN
INTRAMUSCULAR | Status: DC | PRN
  Administered 2023-05-11: 2 mg via INTRAVENOUS

## 2023-05-11 MED ORDER — LIDOCAINE 2% (20 MG/ML) 5 ML SYRINGE
INTRAMUSCULAR | Status: AC
  Filled 2023-05-11: qty 5

## 2023-05-11 MED ORDER — HYDROMORPHONE HCL 1 MG/ML IJ SOLN
0.5000 mg | INTRAMUSCULAR | Status: DC | PRN
  Administered 2023-05-11: 0.5 mg via INTRAVENOUS
  Filled 2023-05-11: qty 1

## 2023-05-11 MED ORDER — DEXAMETHASONE SODIUM PHOSPHATE 10 MG/ML IJ SOLN
INTRAMUSCULAR | Status: AC
  Filled 2023-05-11: qty 1

## 2023-05-11 MED ORDER — 0.9 % SODIUM CHLORIDE (POUR BTL) OPTIME
TOPICAL | Status: DC | PRN
  Administered 2023-05-11: 1000 mL

## 2023-05-11 MED ORDER — AMISULPRIDE (ANTIEMETIC) 5 MG/2ML IV SOLN
10.0000 mg | Freq: Once | INTRAVENOUS | Status: AC
  Administered 2023-05-11: 10 mg via INTRAVENOUS

## 2023-05-11 MED ORDER — PROPOFOL 10 MG/ML IV BOLUS
INTRAVENOUS | Status: DC | PRN
  Administered 2023-05-11: 200 mg via INTRAVENOUS

## 2023-05-11 MED ORDER — SUCCINYLCHOLINE CHLORIDE 200 MG/10ML IV SOSY
PREFILLED_SYRINGE | INTRAVENOUS | Status: AC
  Filled 2023-05-11: qty 10

## 2023-05-11 MED ORDER — SUCCINYLCHOLINE CHLORIDE 200 MG/10ML IV SOSY
PREFILLED_SYRINGE | INTRAVENOUS | Status: DC | PRN
  Administered 2023-05-11: 160 mg via INTRAVENOUS

## 2023-05-11 SURGICAL SUPPLY — 60 items
BAG COUNTER SPONGE SURGICOUNT (BAG) ×2 IMPLANT
BAG DECANTER FOR FLEXI CONT (MISCELLANEOUS) IMPLANT
BLADE MINI RND TIP GREEN BEAV (BLADE) IMPLANT
BLADE SURG 15 STRL LF DISP TIS (BLADE) ×4 IMPLANT
BNDG ELASTIC 3INX 5YD STR LF (GAUZE/BANDAGES/DRESSINGS) ×2 IMPLANT
BNDG ESMARK 4X9 LF (GAUZE/BANDAGES/DRESSINGS) IMPLANT
BNDG GAUZE DERMACEA FLUFF 4 (GAUZE/BANDAGES/DRESSINGS) ×2 IMPLANT
CANISTER SUCT 3000ML PPV (MISCELLANEOUS) ×2 IMPLANT
CHLORAPREP W/TINT 26 (MISCELLANEOUS) ×2 IMPLANT
CORD BIPOLAR FORCEPS 12FT (ELECTRODE) ×2 IMPLANT
COVER MAYO STAND STRL (DRAPES) ×2 IMPLANT
COVER SURGICAL LIGHT HANDLE (MISCELLANEOUS) ×2 IMPLANT
CUFF TOURN SGL QUICK 18X4 (TOURNIQUET CUFF) IMPLANT
DRAPE MICROSCOPE LEICA (MISCELLANEOUS) IMPLANT
DRAPE SURG 17X23 STRL (DRAPES) ×2 IMPLANT
DRSG XEROFORM 1X8 (GAUZE/BANDAGES/DRESSINGS) IMPLANT
GAUZE 4X4 16PLY ~~LOC~~+RFID DBL (SPONGE) IMPLANT
GAUZE SPONGE 4X4 12PLY STRL (GAUZE/BANDAGES/DRESSINGS) ×2 IMPLANT
GAUZE SPONGE 4X4 12PLY STRL LF (GAUZE/BANDAGES/DRESSINGS) IMPLANT
GAUZE XEROFORM 1X8 LF (GAUZE/BANDAGES/DRESSINGS) ×2 IMPLANT
GLOVE BIO SURGEON STRL SZ7.5 (GLOVE) ×2 IMPLANT
GLOVE BIOGEL PI IND STRL 8 (GLOVE) ×2 IMPLANT
GOWN STRL REUS W/ TWL LRG LVL3 (GOWN DISPOSABLE) ×2 IMPLANT
GOWN STRL REUS W/ TWL XL LVL3 (GOWN DISPOSABLE) ×2 IMPLANT
KIT BASIN OR (CUSTOM PROCEDURE TRAY) ×2 IMPLANT
LOOP VASCLR MAXI BLUE 18IN ST (MISCELLANEOUS) IMPLANT
LOOPS VASCLR MAXI BLUE 18IN ST (MISCELLANEOUS) IMPLANT
NDL 18GX1X1/2 (RX/OR ONLY) (NEEDLE) IMPLANT
NDL HYPO 25X1 1.5 SAFETY (NEEDLE) IMPLANT
NEEDLE 18GX1X1/2 (RX/OR ONLY) (NEEDLE) IMPLANT
NEEDLE HYPO 25X1 1.5 SAFETY (NEEDLE) IMPLANT
NS IRRIG 1000ML POUR BTL (IV SOLUTION) ×2 IMPLANT
PACK ORTHO EXTREMITY (CUSTOM PROCEDURE TRAY) ×2 IMPLANT
PAD CAST 3X4 CTTN HI CHSV (CAST SUPPLIES) ×2 IMPLANT
PAD CAST 4YDX4 CTTN HI CHSV (CAST SUPPLIES) IMPLANT
PADDING CAST ABS COTTON 4X4 ST (CAST SUPPLIES) ×2 IMPLANT
SPEAR EYE SURG WECK-CEL (MISCELLANEOUS) ×2 IMPLANT
SPIKE FLUID TRANSFER (MISCELLANEOUS) ×2 IMPLANT
SPLINT PLASTER CAST FAST 3X15 (CAST SUPPLIES) IMPLANT
SPLINT PLASTER CAST XFAST 3X15 (CAST SUPPLIES) IMPLANT
STOCKINETTE 4X48 STRL (DRAPES) ×2 IMPLANT
SUT ETHIBOND 3-0 V-5 (SUTURE) IMPLANT
SUT ETHILON 3 0 PS 1 (SUTURE) IMPLANT
SUT ETHILON 4 0 PS 2 18 (SUTURE) ×2 IMPLANT
SUT ETHILON 9 0 BV130 4 (SUTURE) IMPLANT
SUT ETHILON 9 0 TG140 8 (SUTURE) IMPLANT
SUT FIBERWIRE 4-0 18 TAPR NDL (SUTURE) IMPLANT
SUT NYLON 9 0 VRM6 (SUTURE) IMPLANT
SUT POLY ETHIBOND 5-0 P-3 1X18 (SUTURE) IMPLANT
SUT PROLENE 6 0 P 1 18 (SUTURE) IMPLANT
SUT SILK 4 0 PS 2 (SUTURE) IMPLANT
SUT VICRYL 4-0 PS2 18IN ABS (SUTURE) IMPLANT
SUT VICRYL RAPIDE 4/0 PS 2 (SUTURE) IMPLANT
SUTURE FIBERWR 4-0 18 TAPR NDL (SUTURE) IMPLANT
SYR BULB EAR ULCER 3OZ GRN STR (SYRINGE) ×2 IMPLANT
SYR CONTROL 10ML LL (SYRINGE) IMPLANT
TIE VASCULAR MAXI BLUE 18IN ST (MISCELLANEOUS) IMPLANT
TOWEL GREEN STERILE FF (TOWEL DISPOSABLE) ×4 IMPLANT
TUBE CONNECTING 12X1/4 (SUCTIONS) ×2 IMPLANT
UNDERPAD 30X36 HEAVY ABSORB (UNDERPADS AND DIAPERS) ×2 IMPLANT
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
## 2023-05-11 NOTE — Transfer of Care (Signed)
 Immediate Anesthesia Transfer of Care Note  Patient: Victor Briggs  Procedure(s) Performed: NERVE, TENDON AND ARTERY REPAIR (Left)  Patient Location: PACU  Anesthesia Type:General  Level of Consciousness: drowsy  Airway & Oxygen Therapy: Patient Spontanous Breathing and Patient connected to face mask oxygen  Post-op Assessment: Report given to RN and Post -op Vital signs reviewed and stable  Post vital signs: Reviewed and stable  Last Vitals:  Vitals Value Taken Time  BP    Temp    Pulse 86 05/11/23 0812  Resp 19 05/11/23 0812  SpO2 94 % 05/11/23 0812  Vitals shown include unfiled device data.  Last Pain:  Vitals:   05/11/23 0301  TempSrc:   PainSc: Asleep         Complications: No notable events documented.

## 2023-05-11 NOTE — Discharge Instructions (Signed)

## 2023-05-11 NOTE — Anesthesia Procedure Notes (Signed)
 Procedure Name: Intubation Date/Time: 05/11/2023 4:29 AM  Performed by: Karla Pavone T, CRNAPre-anesthesia Checklist: Patient identified, Emergency Drugs available, Suction available and Patient being monitored Patient Re-evaluated:Patient Re-evaluated prior to induction Oxygen Delivery Method: Circle system utilized Preoxygenation: Pre-oxygenation with 100% oxygen Induction Type: IV induction, Rapid sequence and Cricoid Pressure applied Ventilation: Mask ventilation without difficulty Laryngoscope Size: Mac and 4 Grade View: Grade I Tube type: Oral Tube size: 7.5 mm Number of attempts: 1 Airway Equipment and Method: Stylet and Oral airway Placement Confirmation: ETT inserted through vocal cords under direct vision, positive ETCO2 and breath sounds checked- equal and bilateral Secured at: 23 cm Tube secured with: Tape Dental Injury: Teeth and Oropharynx as per pre-operative assessment

## 2023-05-11 NOTE — Progress Notes (Signed)
 Did handoff with relief circulator, Mizael Sagar Whitewright
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
## 2023-05-11 NOTE — Progress Notes (Signed)
 Orthopedic Tech Progress Note Patient Details:  Victor Briggs Sep 07, 1985 [id number]  Ortho Devices Type of Ortho Device: Arm sling Ortho Device/Splint Location: LUE Ortho Device/Splint Interventions: Ordered, Application   Post Interventions Patient Tolerated: Well  Lovett Calender 05/11/2023, 9:51 AM

## 2023-05-11 NOTE — Anesthesia Postprocedure Evaluation (Signed)
 Anesthesia Post Note  Patient: Victor Briggs  Procedure(s) Performed: NERVE, TENDON AND ARTERY REPAIR (Left)     Patient location during evaluation: PACU Anesthesia Type: General Level of consciousness: awake and alert Pain management: pain level controlled Vital Signs Assessment: post-procedure vital signs reviewed and stable Respiratory status: spontaneous breathing, nonlabored ventilation and respiratory function stable Cardiovascular status: blood pressure returned to baseline and stable Postop Assessment: no apparent nausea or vomiting Anesthetic complications: no   No notable events documented.  Last Vitals:  Vitals:   05/11/23 0845 05/11/23 0900  BP: (!) 149/95 (!) 147/94  Pulse: 87 90  Resp: 12 13  Temp:    SpO2: 95% 94%    Last Pain:  Vitals:   05/11/23 0830  TempSrc:   PainSc: Asleep                 Lowella Curb
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
## 2023-05-12 ENCOUNTER — Encounter (HOSPITAL_COMMUNITY): Payer: Self-pay | Admitting: Orthopedic Surgery

## 2023-10-25 ENCOUNTER — Other Ambulatory Visit: Payer: Self-pay | Admitting: Orthopedic Surgery

## 2023-11-06 ENCOUNTER — Encounter (HOSPITAL_BASED_OUTPATIENT_CLINIC_OR_DEPARTMENT_OTHER): Payer: Self-pay | Admitting: Orthopedic Surgery

## 2023-11-06 ENCOUNTER — Other Ambulatory Visit: Payer: Self-pay

## 2023-11-09 ENCOUNTER — Encounter (HOSPITAL_BASED_OUTPATIENT_CLINIC_OR_DEPARTMENT_OTHER)
Admission: RE | Admit: 2023-11-09 | Discharge: 2023-11-09 | Disposition: A | Discharge status: Home / Self Care | Payer: Self-pay | Source: Ambulatory Visit | Attending: Orthopedic Surgery | Admitting: Orthopedic Surgery

## 2023-11-09 DIAGNOSIS — Z01818 Encounter for other preprocedural examination: Secondary | ICD-10-CM | POA: Insufficient documentation

## 2023-11-09 DIAGNOSIS — R9431 Abnormal electrocardiogram [ECG] [EKG]: Secondary | ICD-10-CM | POA: Insufficient documentation

## 2023-11-09 DIAGNOSIS — I10 Essential (primary) hypertension: Secondary | ICD-10-CM | POA: Insufficient documentation

## 2023-11-09 LAB — BASIC METABOLIC PANEL WITH GFR
Anion gap: 15 (ref 5–15)
BUN: 13 mg/dL (ref 6–20)
CO2: 21 mmol/L — ABNORMAL LOW (ref 22–32)
Calcium: 10 mg/dL (ref 8.9–10.3)
Chloride: 99 mmol/L (ref 98–111)
Creatinine, Ser: 1.19 mg/dL (ref 0.61–1.24)
GFR, Estimated: >60 mL/min (ref >60)
Glucose, Bld: 125 mg/dL — ABNORMAL HIGH (ref 70–99)
Potassium: 5.3 mmol/L — ABNORMAL HIGH (ref 3.5–5.1)
Sodium: 135 mmol/L (ref 135–145)

## 2023-11-09 NOTE — Progress Notes (Signed)

## 2023-11-12 NOTE — Progress Notes (Signed)
 K+ 5.3 Dr. Merla, will recheck BMET day of surgery.

## 2023-11-13 ENCOUNTER — Encounter (HOSPITAL_BASED_OUTPATIENT_CLINIC_OR_DEPARTMENT_OTHER): Payer: Self-pay | Admitting: Orthopedic Surgery

## 2023-11-13 ENCOUNTER — Ambulatory Visit (HOSPITAL_BASED_OUTPATIENT_CLINIC_OR_DEPARTMENT_OTHER): Payer: Self-pay | Admitting: Anesthesiology

## 2023-11-13 ENCOUNTER — Other Ambulatory Visit: Payer: Self-pay

## 2023-11-13 ENCOUNTER — Encounter (HOSPITAL_BASED_OUTPATIENT_CLINIC_OR_DEPARTMENT_OTHER)
Admission: RE | Disposition: A | Discharge status: Home / Self Care | Payer: Self-pay | Source: Home / Self Care | Attending: Orthopedic Surgery

## 2023-11-13 ENCOUNTER — Ambulatory Visit (HOSPITAL_BASED_OUTPATIENT_CLINIC_OR_DEPARTMENT_OTHER)
Admission: RE | Admit: 2023-11-13 | Discharge: 2023-11-13 | Disposition: A | Discharge status: Home / Self Care | Payer: Self-pay | Attending: Orthopedic Surgery | Admitting: Orthopedic Surgery

## 2023-11-13 DIAGNOSIS — S66501A Unspecified injury of intrinsic muscle, fascia and tendon of left index finger at wrist and hand level, initial encounter: Secondary | ICD-10-CM

## 2023-11-13 DIAGNOSIS — Z87891 Personal history of nicotine dependence: Secondary | ICD-10-CM | POA: Insufficient documentation

## 2023-11-13 DIAGNOSIS — S64491A Injury of digital nerve of left index finger, initial encounter: Secondary | ICD-10-CM | POA: Insufficient documentation

## 2023-11-13 DIAGNOSIS — W458XXA Other foreign body or object entering through skin, initial encounter: Secondary | ICD-10-CM | POA: Diagnosis not present

## 2023-11-13 DIAGNOSIS — M21832 Other specified acquired deformities of left forearm: Secondary | ICD-10-CM | POA: Insufficient documentation

## 2023-11-13 DIAGNOSIS — I10 Essential (primary) hypertension: Secondary | ICD-10-CM | POA: Diagnosis not present

## 2023-11-13 DIAGNOSIS — S66509A Unspecified injury of intrinsic muscle, fascia and tendon of unspecified finger at wrist and hand level, initial encounter: Secondary | ICD-10-CM | POA: Insufficient documentation

## 2023-11-13 DIAGNOSIS — Z01818 Encounter for other preprocedural examination: Secondary | ICD-10-CM

## 2023-11-13 HISTORY — PX: TENDON TRANSFER: SHX6109

## 2023-11-13 LAB — BASIC METABOLIC PANEL WITH GFR
Anion gap: 13 (ref 5–15)
BUN: 19 mg/dL (ref 6–20)
CO2: 25 mmol/L (ref 22–32)
Calcium: 9.7 mg/dL (ref 8.9–10.3)
Chloride: 94 mmol/L — ABNORMAL LOW (ref 98–111)
Creatinine, Ser: 1.1 mg/dL (ref 0.61–1.24)
GFR, Estimated: >60 mL/min (ref >60)
Glucose, Bld: 138 mg/dL — ABNORMAL HIGH (ref 70–99)
Potassium: 4 mmol/L (ref 3.5–5.1)
Sodium: 132 mmol/L — ABNORMAL LOW (ref 135–145)

## 2023-11-13 SURGERY — TRANSFER, TENDON
Anesthesia: General | Site: Index Finger | Laterality: Left

## 2023-11-13 MED ORDER — LACTATED RINGERS IV SOLN: INTRAVENOUS | Status: DC

## 2023-11-13 MED ORDER — DEXAMETHASONE SODIUM PHOSPHATE 10 MG/ML IJ SOLN
INTRAMUSCULAR | Status: AC
  Filled 2023-11-13: qty 1

## 2023-11-13 MED ORDER — FENTANYL CITRATE (PF) 100 MCG/2ML IJ SOLN
INTRAMUSCULAR | Status: AC
  Filled 2023-11-13: qty 2

## 2023-11-13 MED ORDER — ATROPINE SULFATE 0.4 MG/ML IV SOLN
INTRAVENOUS | Status: AC
  Filled 2023-11-13: qty 1

## 2023-11-13 MED ORDER — HYDROMORPHONE HCL 1 MG/ML IJ SOLN: 0.2500 mg | INTRAMUSCULAR | Status: DC | PRN

## 2023-11-13 MED ORDER — BUPIVACAINE HCL (PF) 0.25 % IJ SOLN
INTRAMUSCULAR | Status: DC | PRN
  Administered 2023-11-13: 9 mL

## 2023-11-13 MED ORDER — EPHEDRINE 5 MG/ML INJ
INTRAVENOUS | Status: AC
  Filled 2023-11-13: qty 5

## 2023-11-13 MED ORDER — HYDROMORPHONE HCL 1 MG/ML IJ SOLN: INTRAMUSCULAR | Status: DC | PRN

## 2023-11-13 MED ORDER — ROCURONIUM BROMIDE 10 MG/ML (PF) SYRINGE
PREFILLED_SYRINGE | INTRAVENOUS | Status: AC
  Filled 2023-11-13: qty 10

## 2023-11-13 MED ORDER — PHENYLEPHRINE 80 MCG/ML (10ML) SYRINGE FOR IV PUSH (FOR BLOOD PRESSURE SUPPORT)
PREFILLED_SYRINGE | INTRAVENOUS | Status: AC
  Filled 2023-11-13: qty 10

## 2023-11-13 MED ORDER — LABETALOL HCL 5 MG/ML IV SOLN
INTRAVENOUS | Status: AC
  Filled 2023-11-13: qty 4

## 2023-11-13 MED ORDER — ONDANSETRON HCL 4 MG/2ML IJ SOLN
INTRAMUSCULAR | Status: DC | PRN
  Administered 2023-11-13: 4 mg via INTRAVENOUS

## 2023-11-13 MED ORDER — CEFAZOLIN SODIUM-DEXTROSE 2-4 GM/100ML-% IV SOLN
2.0000 g | INTRAVENOUS | Status: AC
Start: 2023-11-13 — End: 2023-11-13
  Administered 2023-11-13: 2 g via INTRAVENOUS

## 2023-11-13 MED ORDER — KETOROLAC TROMETHAMINE 30 MG/ML IJ SOLN
INTRAMUSCULAR | Status: DC | PRN
  Administered 2023-11-13: 30 mg via INTRAVENOUS

## 2023-11-13 MED ORDER — MIDAZOLAM HCL 5 MG/5ML IJ SOLN
INTRAMUSCULAR | Status: DC | PRN
  Administered 2023-11-13: 2 mg via INTRAVENOUS

## 2023-11-13 MED ORDER — DEXMEDETOMIDINE HCL IN NACL 80 MCG/20ML IV SOLN
INTRAVENOUS | Status: DC | PRN
  Administered 2023-11-13: 12 ug via INTRAVENOUS

## 2023-11-13 MED ORDER — PROPOFOL 500 MG/50ML IV EMUL
INTRAVENOUS | Status: DC | PRN
  Administered 2023-11-13: 125 ug/kg/min via INTRAVENOUS

## 2023-11-13 MED ORDER — PROPOFOL 10 MG/ML IV BOLUS
INTRAVENOUS | Status: DC | PRN
  Administered 2023-11-13: 50 mg via INTRAVENOUS

## 2023-11-13 MED ORDER — 0.9 % SODIUM CHLORIDE (POUR BTL) OPTIME
TOPICAL | Status: DC | PRN
  Administered 2023-11-13: 120 mL

## 2023-11-13 MED ORDER — LABETALOL HCL 5 MG/ML IV SOLN
5.0000 mg | Freq: Once | INTRAVENOUS | Status: AC
  Administered 2023-11-13: 5 mg via INTRAVENOUS

## 2023-11-13 MED ORDER — HYDROCODONE-ACETAMINOPHEN 5-325 MG PO TABS: 1.0000 | ORAL_TABLET | Freq: Four times a day (QID) | ORAL | 0 refills | Status: AC | PRN

## 2023-11-13 MED ORDER — SUCCINYLCHOLINE CHLORIDE 200 MG/10ML IV SOSY
PREFILLED_SYRINGE | INTRAVENOUS | Status: AC
  Filled 2023-11-13: qty 10

## 2023-11-13 MED ORDER — ONDANSETRON HCL 4 MG/2ML IJ SOLN: 4.0000 mg | Freq: Once | INTRAMUSCULAR | Status: DC | PRN

## 2023-11-13 MED ORDER — PROPOFOL 10 MG/ML IV BOLUS
INTRAVENOUS | Status: AC
  Filled 2023-11-13: qty 20

## 2023-11-13 MED ORDER — DEXAMETHASONE SODIUM PHOSPHATE 10 MG/ML IJ SOLN
INTRAMUSCULAR | Status: DC | PRN
  Administered 2023-11-13: 10 mg via INTRAVENOUS

## 2023-11-13 MED ORDER — LIDOCAINE 2% (20 MG/ML) 5 ML SYRINGE
INTRAMUSCULAR | Status: DC | PRN
  Administered 2023-11-13: 100 mg via INTRAVENOUS

## 2023-11-13 MED ORDER — ACETAMINOPHEN 10 MG/ML IV SOLN
INTRAVENOUS | Status: DC | PRN
  Administered 2023-11-13: 1000 mg via INTRAVENOUS

## 2023-11-13 MED ORDER — MIDAZOLAM HCL 2 MG/2ML IJ SOLN
INTRAMUSCULAR | Status: AC
  Filled 2023-11-13: qty 2

## 2023-11-13 MED ORDER — OXYCODONE HCL 5 MG PO TABS: 5.0000 mg | ORAL_TABLET | Freq: Once | ORAL | Status: DC | PRN

## 2023-11-13 MED ORDER — FENTANYL CITRATE (PF) 100 MCG/2ML IJ SOLN
INTRAMUSCULAR | Status: DC | PRN
  Administered 2023-11-13 (×2): 50 ug via INTRAVENOUS

## 2023-11-13 MED ORDER — LIDOCAINE 2% (20 MG/ML) 5 ML SYRINGE
INTRAMUSCULAR | Status: AC
  Filled 2023-11-13: qty 5

## 2023-11-13 MED ORDER — ONDANSETRON HCL 4 MG/2ML IJ SOLN
INTRAMUSCULAR | Status: AC
  Filled 2023-11-13: qty 2

## 2023-11-13 MED ORDER — OXYCODONE HCL 5 MG/5ML PO SOLN: 5.0000 mg | Freq: Once | ORAL | Status: DC | PRN

## 2023-11-13 SURGICAL SUPPLY — 62 items
BAG DECANTER FOR FLEXI CONT (MISCELLANEOUS) IMPLANT
BLADE MINI RND TIP GREEN BEAV (BLADE) ×1 IMPLANT
BLADE SURG 15 STRL LF DISP TIS (BLADE) ×6 IMPLANT
BNDG COMPR ESMARK 4X3 LF (GAUZE/BANDAGES/DRESSINGS) ×3 IMPLANT
BNDG ELASTIC 3INX 5YD STR LF (GAUZE/BANDAGES/DRESSINGS) ×4 IMPLANT
BNDG GAUZE DERMACEA FLUFF 4 (GAUZE/BANDAGES/DRESSINGS) ×1 IMPLANT
CHLORAPREP W/TINT 26 (MISCELLANEOUS) ×3 IMPLANT
CORD BIPOLAR FORCEPS 12FT (ELECTRODE) ×3 IMPLANT
COTTONBALL LRG STERILE PKG (GAUZE/BANDAGES/DRESSINGS) IMPLANT
COVER BACK TABLE 60X90IN (DRAPES) ×3 IMPLANT
COVER MAYO STAND STRL (DRAPES) ×3 IMPLANT
CUFF TOURN SGL QUICK 18X4 (TOURNIQUET CUFF) ×3 IMPLANT
DRAPE EXTREMITY T 121X128X90 (DISPOSABLE) ×3 IMPLANT
DRAPE OEC MINIVIEW 54X84 (DRAPES) IMPLANT
DRAPE SURG 17X23 STRL (DRAPES) ×3 IMPLANT
GAUZE 4X4 16PLY ~~LOC~~+RFID DBL (SPONGE) IMPLANT
GAUZE PAD ABD 8X10 STRL (GAUZE/BANDAGES/DRESSINGS) ×1 IMPLANT
GAUZE XEROFORM 1X8 LF (GAUZE/BANDAGES/DRESSINGS) ×3 IMPLANT
GLOVE BIO SURGEON STRL SZ7.5 (GLOVE) ×3 IMPLANT
GLOVE BIOGEL PI IND STRL 7.0 (GLOVE) ×1 IMPLANT
GLOVE BIOGEL PI IND STRL 7.5 (GLOVE) ×1 IMPLANT
GLOVE BIOGEL PI IND STRL 8 (GLOVE) ×3 IMPLANT
GLOVE SURG ORTHO 8.0 STRL STRW (GLOVE) ×1 IMPLANT
GLOVE SURG SS PI 7.0 STRL IVOR (GLOVE) ×1 IMPLANT
GOWN STRL REUS W/ TWL LRG LVL3 (GOWN DISPOSABLE) ×2 IMPLANT
GOWN STRL REUS W/ TWL XL LVL3 (GOWN DISPOSABLE) ×1 IMPLANT
GOWN STRL REUS W/TWL XL LVL3 (GOWN DISPOSABLE) ×4 IMPLANT
LOOP VASCLR MAXI BLUE 18IN ST (MISCELLANEOUS) IMPLANT
NDL HYPO 25X1 1.5 SAFETY (NEEDLE) IMPLANT
NDL KEITH (NEEDLE) IMPLANT
NEEDLE HYPO 25X1 1.5 SAFETY (NEEDLE) ×2 IMPLANT
NEEDLE KEITH (NEEDLE) IMPLANT
NS IRRIG 1000ML POUR BTL (IV SOLUTION) ×3 IMPLANT
PACK BASIN DAY SURGERY FS (CUSTOM PROCEDURE TRAY) ×3 IMPLANT
PAD CAST 3X4 CTTN HI CHSV (CAST SUPPLIES) ×3 IMPLANT
PAD CAST 4YDX4 CTTN HI CHSV (CAST SUPPLIES) IMPLANT
PADDING CAST ABS COTTON 3X4 (CAST SUPPLIES) IMPLANT
PADDING CAST ABS COTTON 4X4 ST (CAST SUPPLIES) ×3 IMPLANT
SLEEVE SCD COMPRESS KNEE MED (STOCKING) ×1 IMPLANT
SPIKE FLUID TRANSFER (MISCELLANEOUS) IMPLANT
SPLINT PLASTER CAST XFAST 3X15 (CAST SUPPLIES) ×10 IMPLANT
STOCKINETTE 4X48 STRL (DRAPES) ×3 IMPLANT
SUT CHROMIC 5 0 P 3 (SUTURE) IMPLANT
SUT ETHIBOND 3-0 V-5 (SUTURE) IMPLANT
SUT ETHILON 3 0 PS 1 (SUTURE) IMPLANT
SUT ETHILON 4 0 PS 2 18 (SUTURE) ×1 IMPLANT
SUT MERSILENE 2.0 SH NDLE (SUTURE) IMPLANT
SUT MERSILENE 4 0 P 3 (SUTURE) ×1 IMPLANT
SUT PROLENE 2 0 SH DA (SUTURE) IMPLANT
SUT PROLENE 6 0 P 1 18 (SUTURE) IMPLANT
SUT SILK 2 0 PERMA HAND 18 BK (SUTURE) IMPLANT
SUT SILK 4 0 PS 2 (SUTURE) IMPLANT
SUT VIC AB 3-0 PS1 18XBRD (SUTURE) IMPLANT
SUT VIC AB 4-0 P-3 18XBRD (SUTURE) IMPLANT
SUT VIC AB 4-0 PS2 18 (SUTURE) IMPLANT
SUTURE FIBERWR 3-0 18 TAPR NDL (SUTURE) IMPLANT
SUTURE FIBERWR 4-0 18 DIA BLUE (SUTURE) IMPLANT
SYR BULB EAR ULCER 3OZ GRN STR (SYRINGE) ×3 IMPLANT
SYR CONTROL 10ML LL (SYRINGE) ×1 IMPLANT
TOWEL GREEN STERILE FF (TOWEL DISPOSABLE) ×6 IMPLANT
TUBE NG 5FR 35IN ENFIT (TUBING) IMPLANT
UNDERPAD 30X36 HEAVY ABSORB (UNDERPADS AND DIAPERS) ×3 IMPLANT

## 2023-11-13 NOTE — Anesthesia Preprocedure Evaluation (Addendum)
 Anesthesia Evaluation  Patient identified by MRN, date of birth, ID band Patient awake    Reviewed: Allergy & Precautions, NPO status , Patient's Chart, lab work & pertinent test results  Airway Mallampati: II  TM Distance: >3 FB Neck ROM: Full    Dental no notable dental hx. (+) Dental Advisory Given, Missing, Poor Dentition   Pulmonary former smoker   Pulmonary exam normal breath sounds clear to auscultation       Cardiovascular hypertension, Pt. on medications Normal cardiovascular exam Rhythm:Regular Rate:Normal     Neuro/Psych negative neurological ROS  negative psych ROS   GI/Hepatic negative GI ROS, Neg liver ROS,,,  Endo/Other  negative endocrine ROS    Renal/GU negative Renal ROS  negative genitourinary   Musculoskeletal Injury intrinsic muscle of index finger   Abdominal   Peds  Hematology negative hematology ROS (+)   Anesthesia Other Findings   Reproductive/Obstetrics                              Anesthesia Physical Anesthesia Plan  ASA: 2  Anesthesia Plan: General   Post-op Pain Management: Minimal or no pain anticipated, Dilaudid  IV and Precedex    Induction: Intravenous  PONV Risk Score and Plan: 2 and Treatment may vary due to age or medical condition, Propofol  infusion, Midazolam  and Ondansetron   Airway Management Planned: LMA  Additional Equipment: None  Intra-op Plan:   Post-operative Plan: Extubation in OR  Informed Consent: I have reviewed the patients History and Physical, chart, labs and discussed the procedure including the risks, benefits and alternatives for the proposed anesthesia with the patient or authorized representative who has indicated his/her understanding and acceptance.     Dental advisory given  Plan Discussed with: CRNA and Anesthesiologist  Anesthesia Plan Comments: (Patient refuses regional anesthesia)         Anesthesia  Quick Evaluation

## 2023-11-13 NOTE — Anesthesia Postprocedure Evaluation (Signed)
 Anesthesia Post Note  Patient: Victor Briggs  Procedure(s) Performed: LEFT EXTENSOR INDICIS PROPRIUS TO INDEX INTRINSIC TRANSFER TENDON TRANSFER (Left: Index Finger)     Patient location during evaluation: PACU Anesthesia Type: General Level of consciousness: awake and alert and oriented Pain management: pain level controlled Vital Signs Assessment: post-procedure vital signs reviewed and stable Respiratory status: spontaneous breathing, nonlabored ventilation and respiratory function stable Cardiovascular status: blood pressure returned to baseline and stable Postop Assessment: no apparent nausea or vomiting Anesthetic complications: no   No notable events documented.  Last Vitals:  Vitals:   11/13/23 1516 11/13/23 1530  BP: 127/85 126/81  Pulse: 78 78  Resp: 15 16  Temp:    SpO2: 98% 93%    Last Pain:  Vitals:   11/13/23 1503  TempSrc:   PainSc: Asleep                 Enyla Lisbon A.

## 2023-11-13 NOTE — Discharge Instructions (Addendum)
 [facility] Instructions Hand Surgery  Wound Care: Keep your hand elevated above the level of your heart.  Do not allow it to dangle by your side.  Keep the dressing dry and do not remove it unless your doctor advises you to do so.  He will usually change it at the time of your post-op visit.  Moving your fingers is advised to stimulate circulation but will depend on the site of your surgery.  If you have a splint applied, your doctor will advise you regarding movement.  Activity: Do not drive or operate machinery today.  Rest today and then you may return to your normal activity and work as indicated by your physician.  Diet:  Drink liquids today or eat a light diet.  You may resume a regular diet tomorrow.    General expectations: Pain for two to three days. Fingers may become slightly swollen.  Call your doctor if any of the following occur: Severe pain not relieved by pain medication. Elevated temperature. Dressing soaked with blood. Inability to move fingers. White or bluish color to fingers.   No Tylenol  until 8:21pm, if needed.

## 2023-11-13 NOTE — H&P (Signed)
  Victor Briggs is an 38 y.o. male.   Chief Complaint: left hand intrinsic muscle injury HPI: 38 yo male with left index dorsal interosseous muscle/nerve injury.  He has ulnar drift of the index finger.  He wishes to have EIP to intrinsic transfer to help with the ulnar drift.  Allergies: No Known Allergies  Past Medical History:  Diagnosis Date   Hypertension     Past Surgical History:  Procedure Laterality Date   CYSTOSCOPY N/A 06/30/2016   Procedure: CYSTOSCOPY FLEXIBLE;  Surgeon: Alvaro Hummer, MD;  Location: [facility] OR;  Service: Urology;  Laterality: N/A;   NERVE, TENDON AND ARTERY REPAIR Left 05/11/2023   Procedure: NERVE, TENDON AND ARTERY REPAIR;  Surgeon: Murrell Drivers, MD;  Location: [facility] OR;  Service: Orthopedics;  Laterality: Left;  LEFT HAND EXPLORATION, REPAIR OF TENDONS, ARTERY AND NERVES.   SCROTAL EXPLORATION N/A 06/30/2016   Procedure: PENILE EXPLORATION WITH REPAIR OF PENILE FX;  Surgeon: Alvaro Hummer, MD;  Location: [facility] OR;  Service: Urology;  Laterality: N/A;    Family History: History reviewed. No pertinent family history.  Social History:   reports that he has quit smoking. His smoking use included cigarettes. He has never used smokeless tobacco. He reports that he does not currently use drugs after having used the following drugs: Marijuana. He reports that he does not drink alcohol.  Medications: Medications Prior to Admission  Medication Sig Dispense Refill   amLODipine  (NORVASC ) 5 MG tablet Take 5 mg by mouth daily.     hydrochlorothiazide (HYDRODIURIL) 25 MG tablet Take 25 mg by mouth daily.      No results found for this or any previous visit (from the past 48 hours).  No results found.    Blood pressure (!) 164/112, pulse 82, temperature 98 F (36.7 C), temperature source Temporal, resp. rate 16, height 6' 3 (1.905 m), weight 96.8 kg, SpO2 99%.  General appearance: alert, cooperative, and appears stated age Head: Normocephalic, without obvious  abnormality, atraumatic Neck: supple, symmetrical, trachea midline Extremities: Intact sensation and capillary refill all digits.  +epl/fpl/io.  No wounds.  Skin: Skin color, texture, turgor normal. No rashes or lesions Neurologic: Grossly normal Incision/Wound: none  Assessment/Plan Left index finger interosseous muscle/nerve injury.  Non operative and operative treatment options have been discussed with the patient and patient wishes to proceed with operative treatment. Risks, benefits, and alternatives of surgery have been discussed and the patient agrees with the plan of care.   Victor Briggs 11/13/2023, 12:41 PM

## 2023-11-13 NOTE — Op Note (Signed)
 I assisted Surgeons and Role:    * Murrell Drivers, MD - Primary    DEWAINE Murrell Kuba, MD - Assisting on the Procedure(s): LEFT EXTENSOR INDICIS PROPRIUS TO INDEX INTRINSIC TRANSFER TENDON TRANSFER on 11/13/2023.  I provided assistance on this case as follows: Set up approach: Isolation of the extensor tendon, isolation with identification of the left first dorsal interosseous muscle and tendon,  harvesting extensor indicis proprius, centralization of the extensor digitorum,communis, transfer of the extensor indicis proprius, closure of wound bed and application of dressing and splint   Electronically signed by: Kuba Murrell, MD Date: 11/13/2023 Time: 2:51 PM

## 2023-11-13 NOTE — Anesthesia Procedure Notes (Signed)
 Procedure Name: LMA Insertion Date/Time: 11/13/2023 2:02 PM  Performed by: Denton Niels CROME, CRNAPre-anesthesia Checklist: Patient identified, Emergency Drugs available, Suction available, Patient being monitored and Timeout performed Patient Re-evaluated:Patient Re-evaluated prior to induction Oxygen Delivery Method: Circle system utilized Preoxygenation: Pre-oxygenation with 100% oxygen Induction Type: IV induction Ventilation: Mask ventilation without difficulty LMA: LMA inserted LMA Size: 4.5 Number of attempts: 1 Placement Confirmation: positive ETCO2 Dental Injury: Teeth and Oropharynx as per pre-operative assessment
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
## 2023-11-13 NOTE — Transfer of Care (Signed)
 Immediate Anesthesia Transfer of Care Note  Patient: Victor Briggs  Procedure(s) Performed: LEFT EXTENSOR INDICIS PROPRIUS TO INDEX INTRINSIC TRANSFER TENDON TRANSFER (Left: Index Finger)  Patient Location: PACU  Anesthesia Type:General  Level of Consciousness: drowsy and responds to stimulation  Airway & Oxygen Therapy: Patient Spontanous Breathing and Patient connected to face mask oxygen  Post-op Assessment: Report given to RN and Post -op Vital signs reviewed and stable  Post vital signs: Reviewed and stable  Last Vitals:  Vitals Value Taken Time  BP 134/76 11/13/23 15:03  Temp 36.8 C 11/13/23 15:03  Pulse 77 11/13/23 15:12  Resp 15 11/13/23 15:12  SpO2 97 % 11/13/23 15:12  Vitals shown include unfiled device data.  Last Pain:  Vitals:   11/13/23 1503  TempSrc:   PainSc: Asleep      Patients Stated Pain Goal: 4 (11/13/23 1222)  Complications: No notable events documented.

## 2023-11-14 ENCOUNTER — Encounter (HOSPITAL_BASED_OUTPATIENT_CLINIC_OR_DEPARTMENT_OTHER): Payer: Self-pay | Admitting: Orthopedic Surgery

## 2023-12-19 ENCOUNTER — Other Ambulatory Visit (HOSPITAL_COMMUNITY): Payer: Self-pay
